# Patient Record
Sex: Male | Born: 1965 | Race: White | Hispanic: No | Smoking: Current every day smoker
Health system: Southern US, Community
[De-identification: ages and names within clinical notes are randomized; demographics above are authoritative.]

## PROBLEM LIST (undated history)

## (undated) DIAGNOSIS — I2510 Atherosclerotic heart disease of native coronary artery without angina pectoris: Secondary | ICD-10-CM

## (undated) DIAGNOSIS — I10 Essential (primary) hypertension: Secondary | ICD-10-CM

## (undated) DIAGNOSIS — H3411 Central retinal artery occlusion, right eye: Secondary | ICD-10-CM

## (undated) HISTORY — PX: OTHER SURGICAL HISTORY: SHX169

## (undated) HISTORY — PX: CARDIAC CATHETERIZATION: SHX172

## (undated) HISTORY — DX: Essential (primary) hypertension: I10

## (undated) HISTORY — PX: CORONARY ANGIOPLASTY: SHX604

## (undated) HISTORY — DX: Central retinal artery occlusion, right eye: H34.11

---

## 1999-06-22 ENCOUNTER — Emergency Department (HOSPITAL_COMMUNITY): Admission: EM | Admit: 1999-06-22 | Discharge: 1999-06-22 | Payer: Self-pay

## 1999-06-23 ENCOUNTER — Encounter: Payer: Self-pay | Admitting: Emergency Medicine

## 2012-10-11 ENCOUNTER — Emergency Department (HOSPITAL_COMMUNITY): Payer: 59

## 2012-10-11 ENCOUNTER — Inpatient Hospital Stay (HOSPITAL_COMMUNITY)
Admission: EM | Admit: 2012-10-11 | Discharge: 2012-10-12 | DRG: 247 | Disposition: A | Discharge status: Home / Self Care | Payer: 59 | Attending: Cardiology | Admitting: Cardiology

## 2012-10-11 ENCOUNTER — Encounter (HOSPITAL_COMMUNITY)
Admission: EM | Disposition: A | Discharge status: Home / Self Care | Payer: Self-pay | Source: Home / Self Care | Attending: Cardiology

## 2012-10-11 DIAGNOSIS — F17200 Nicotine dependence, unspecified, uncomplicated: Secondary | ICD-10-CM | POA: Diagnosis present

## 2012-10-11 DIAGNOSIS — I2510 Atherosclerotic heart disease of native coronary artery without angina pectoris: Principal | ICD-10-CM | POA: Diagnosis present

## 2012-10-11 DIAGNOSIS — E739 Lactose intolerance, unspecified: Secondary | ICD-10-CM | POA: Diagnosis present

## 2012-10-11 DIAGNOSIS — I200 Unstable angina: Secondary | ICD-10-CM | POA: Diagnosis present

## 2012-10-11 DIAGNOSIS — I10 Essential (primary) hypertension: Secondary | ICD-10-CM | POA: Diagnosis present

## 2012-10-11 DIAGNOSIS — E7800 Pure hypercholesterolemia, unspecified: Secondary | ICD-10-CM | POA: Diagnosis present

## 2012-10-11 DIAGNOSIS — Z8249 Family history of ischemic heart disease and other diseases of the circulatory system: Secondary | ICD-10-CM

## 2012-10-11 DIAGNOSIS — I249 Acute ischemic heart disease, unspecified: Secondary | ICD-10-CM

## 2012-10-11 HISTORY — PX: LEFT HEART CATHETERIZATION WITH CORONARY ANGIOGRAM: SHX5451

## 2012-10-11 LAB — MAGNESIUM: Magnesium: 2 mg/dL (ref 1.5–2.5)

## 2012-10-11 LAB — BASIC METABOLIC PANEL
CO2: 29 mEq/L (ref 19–32)
Calcium: 9.7 mg/dL (ref 8.4–10.5)
Creatinine, Ser: 0.8 mg/dL (ref 0.50–1.35)
Glucose, Bld: 112 mg/dL — ABNORMAL HIGH (ref 70–99)

## 2012-10-11 LAB — CBC
Hemoglobin: 14.8 g/dL (ref 13.0–17.0)
MCH: 31.5 pg (ref 26.0–34.0)
MCV: 91.3 fL (ref 78.0–100.0)
RBC: 4.7 MIL/uL (ref 4.22–5.81)

## 2012-10-11 LAB — PROTIME-INR: Prothrombin Time: 15.9 seconds — ABNORMAL HIGH (ref 11.6–15.2)

## 2012-10-11 SURGERY — LEFT HEART CATHETERIZATION WITH CORONARY ANGIOGRAM
Anesthesia: LOCAL

## 2012-10-11 MED ORDER — FENTANYL CITRATE 0.05 MG/ML IJ SOLN
INTRAMUSCULAR | Status: AC
  Filled 2012-10-11: qty 2

## 2012-10-11 MED ORDER — BIVALIRUDIN 250 MG IV SOLR
INTRAVENOUS | Status: AC
  Filled 2012-10-11: qty 250

## 2012-10-11 MED ORDER — EPTIFIBATIDE 75 MG/100ML IV SOLN
2.0000 ug/kg/min | INTRAVENOUS | Status: AC
  Administered 2012-10-11: 2 ug/kg/min via INTRAVENOUS
  Filled 2012-10-11: qty 100

## 2012-10-11 MED ORDER — SODIUM CHLORIDE 0.9 % IJ SOLN: 3.0000 mL | INTRAMUSCULAR | Status: DC | PRN

## 2012-10-11 MED ORDER — ASPIRIN EC 325 MG PO TBEC
325.0000 mg | DELAYED_RELEASE_TABLET | Freq: Once | ORAL | Status: AC
  Administered 2012-10-11: 325 mg via ORAL
  Filled 2012-10-11: qty 1

## 2012-10-11 MED ORDER — NITROGLYCERIN 0.2 MG/ML ON CALL CATH LAB
INTRAVENOUS | Status: AC
  Filled 2012-10-11: qty 1

## 2012-10-11 MED ORDER — SODIUM CHLORIDE 0.9 % IV SOLN
INTRAVENOUS | Status: AC
  Administered 2012-10-11: 17:00:00 via INTRAVENOUS

## 2012-10-11 MED ORDER — METOPROLOL TARTRATE 25 MG PO TABS
25.0000 mg | ORAL_TABLET | Freq: Two times a day (BID) | ORAL | Status: DC
  Administered 2012-10-11 – 2012-10-12 (×2): 25 mg via ORAL
  Filled 2012-10-11 (×2): qty 1

## 2012-10-11 MED ORDER — ATORVASTATIN CALCIUM 80 MG PO TABS
80.0000 mg | ORAL_TABLET | Freq: Every day | ORAL | Status: DC
  Administered 2012-10-11: 80 mg via ORAL
  Filled 2012-10-11 (×2): qty 1

## 2012-10-11 MED ORDER — ONDANSETRON HCL 4 MG/2ML IJ SOLN: 4.0000 mg | Freq: Four times a day (QID) | INTRAMUSCULAR | Status: DC | PRN

## 2012-10-11 MED ORDER — NITROGLYCERIN 0.4 MG SL SUBL: 0.4000 mg | SUBLINGUAL_TABLET | SUBLINGUAL | Status: DC | PRN

## 2012-10-11 MED ORDER — ASPIRIN 81 MG PO CHEW
81.0000 mg | CHEWABLE_TABLET | Freq: Every day | ORAL | Status: DC
  Filled 2012-10-11: qty 1

## 2012-10-11 MED ORDER — SODIUM CHLORIDE 0.9 % IV SOLN: INTRAVENOUS | Status: DC

## 2012-10-11 MED ORDER — ASPIRIN 300 MG RE SUPP
300.0000 mg | RECTAL | Status: DC
Start: 2012-10-11 — End: 2012-10-11

## 2012-10-11 MED ORDER — SODIUM CHLORIDE 0.9 % IV SOLN
0.2500 mg/kg/h | INTRAVENOUS | Status: AC
  Filled 2012-10-11: qty 250

## 2012-10-11 MED ORDER — SODIUM CHLORIDE 0.9 % IV SOLN: 250.0000 mL | INTRAVENOUS | Status: DC | PRN

## 2012-10-11 MED ORDER — ASPIRIN 81 MG PO CHEW: 324.0000 mg | CHEWABLE_TABLET | ORAL | Status: AC

## 2012-10-11 MED ORDER — MORPHINE SULFATE 2 MG/ML IJ SOLN: 2.0000 mg | INTRAMUSCULAR | Status: DC | PRN

## 2012-10-11 MED ORDER — LIDOCAINE HCL (PF) 1 % IJ SOLN
INTRAMUSCULAR | Status: AC
  Filled 2012-10-11: qty 30

## 2012-10-11 MED ORDER — MIDAZOLAM HCL 2 MG/2ML IJ SOLN
INTRAMUSCULAR | Status: AC
  Filled 2012-10-11: qty 2

## 2012-10-11 MED ORDER — ACETAMINOPHEN 325 MG PO TABS: 650.0000 mg | ORAL_TABLET | ORAL | Status: DC | PRN

## 2012-10-11 MED ORDER — HEPARIN SODIUM (PORCINE) 5000 UNIT/ML IJ SOLN
4000.0000 [IU] | Freq: Once | INTRAMUSCULAR | Status: AC
  Administered 2012-10-11: 4000 [IU] via INTRAVENOUS
  Filled 2012-10-11: qty 1

## 2012-10-11 MED ORDER — HEPARIN BOLUS VIA INFUSION: 4000.0000 [IU] | Freq: Once | INTRAVENOUS | Status: DC

## 2012-10-11 MED ORDER — ASPIRIN 81 MG PO CHEW: 324.0000 mg | CHEWABLE_TABLET | ORAL | Status: DC

## 2012-10-11 MED ORDER — DIAZEPAM 5 MG PO TABS: 5.0000 mg | ORAL_TABLET | ORAL | Status: AC

## 2012-10-11 MED ORDER — TICAGRELOR 90 MG PO TABS
ORAL_TABLET | ORAL | Status: AC
  Filled 2012-10-11: qty 2

## 2012-10-11 MED ORDER — SODIUM CHLORIDE 0.9 % IJ SOLN: 3.0000 mL | Freq: Two times a day (BID) | INTRAMUSCULAR | Status: DC

## 2012-10-11 MED ORDER — MORPHINE SULFATE 2 MG/ML IJ SOLN
INTRAMUSCULAR | Status: AC
  Filled 2012-10-11: qty 1

## 2012-10-11 MED ORDER — TICAGRELOR 90 MG PO TABS
180.0000 mg | ORAL_TABLET | Freq: Once | ORAL | Status: DC
  Filled 2012-10-11: qty 2

## 2012-10-11 MED ORDER — TICAGRELOR 90 MG PO TABS
90.0000 mg | ORAL_TABLET | Freq: Two times a day (BID) | ORAL | Status: DC
  Administered 2012-10-11 – 2012-10-12 (×2): 90 mg via ORAL
  Filled 2012-10-11 (×3): qty 1

## 2012-10-11 MED ORDER — NITROGLYCERIN IN D5W 200-5 MCG/ML-% IV SOLN
5.0000 ug/min | INTRAVENOUS | Status: DC
  Administered 2012-10-11: 5 ug/min via INTRAVENOUS

## 2012-10-11 MED ORDER — ASPIRIN EC 81 MG PO TBEC
81.0000 mg | DELAYED_RELEASE_TABLET | Freq: Every day | ORAL | Status: DC
  Administered 2012-10-12: 11:00:00 81 mg via ORAL
  Filled 2012-10-11: qty 1

## 2012-10-11 MED ORDER — EPTIFIBATIDE 75 MG/100ML IV SOLN
2.0000 ug/kg/min | INTRAVENOUS | Status: DC
  Filled 2012-10-11: qty 100

## 2012-10-11 NOTE — Progress Notes (Signed)
ANTICOAGULATION CONSULT NOTE - Follow Up Consult  Pharmacy Consult for Integrilin x 12 hours post-PCI Indication: post-PCI anti-platelet therapy  Allergies  Allergen Reactions  . Codeine Nausea And Vomiting    Patient Measurements: Height: 5\' 9"  (175.3 cm) Weight: 160 lb (72.576 kg) IBW/kg (Calculated) : 70.7  Integrilin Dosing Weight: 72.6 kg  Vital Signs: Temp: 97.7 F (36.5 C) (11/05 1647) Temp src: Oral (11/05 1647) BP: 130/71 mmHg (11/05 1647) Pulse Rate: 70  (11/05 1647)  Labs:  Basename 10/11/12 1135  HGB 14.8  HCT 42.9  PLT 245  APTT --  LABPROT --  INR --  HEPARINUNFRC --  CREATININE 0.80  CKTOTAL --  CKMB --  TROPONINI --    Estimated Creatinine Clearance: 112.9 ml/min (by C-G formula based on Cr of 0.8).  Assessment:   Angiomax and Integrilin begun in Cath Lab ~3pm. Now S/p PTCA/stent.   Angiomax bag almost empty;  Integrilin to continue for 12 hours.  RN reports no bleeding from groin site. Sheath still in.  Goal of Therapy:  Appropriate Integrilin dose for renal function; Platelet count > 150K Monitor platelets by anticoagulation protocol: Yes   Plan:    Continue Integrilin at 2 mcg/kg/min.   CBC ~ 8 hours after Integrilin began = 11pm.   Integrilin to stop ~3:10am.  Dennie Fetters, RPh Pager: [phone] 10/11/2012,5:15 PM

## 2012-10-11 NOTE — ED Notes (Signed)
Patient stated having chest pain with shortness of breath, diaphoresis, nausea and radiating pain to his left arm and leg.

## 2012-10-11 NOTE — H&P (Signed)
Blake Hughes is an 46 y.o. male.   Chief Complaint: Recurrent chest HPI: Patient is 46 year old male with no significant past medical history except for tobacco abuse and positive family history of coronary artery disease he came to the ER complaining of recurrent retrosternal and left-sided chest pain described as pressure tightness radiating to left arm off and on since Friday today while at work climbing the stairs the left severe chest pain grade 9/10 radiating to left arm associated with diaphoresis and shortness of breath. EKG done in the ER showed normal sinus rhythm with a minor T wave inversion in lead 1 and q. and T wave inversion in lead aVL and the minor T wave changes in septal leads. And was noted to have minimally elevated troponin I. Patient denies any palpitation lightheadedness or syncope denies pain the orthopnea leg swelling  No past medical history on file.  No past surgical history on file.  No family history on file. Social History:  does not have a smoking history on file. He does not have any smokeless tobacco history on file. His alcohol and drug histories not on file.  Allergies:  Allergies  Allergen Reactions  . Codeine Nausea And Vomiting     (Not in a hospital admission)  Results for orders placed during the hospital encounter of 10/11/12 (from the past 48 hour(s))  CBC     Status: Normal   Collection Time   10/11/12 11:35 AM      Component Value Range Comment   WBC 6.9  4.0 - 10.5 K/uL    RBC 4.70  4.22 - 5.81 MIL/uL    Hemoglobin 14.8  13.0 - 17.0 g/dL    HCT 16.1  09.6 - 04.5 %    MCV 91.3  78.0 - 100.0 fL    MCH 31.5  26.0 - 34.0 pg    MCHC 34.5  30.0 - 36.0 g/dL    RDW 40.9  81.1 - 91.4 %    Platelets 245  150 - 400 K/uL   BASIC METABOLIC PANEL     Status: Abnormal   Collection Time   10/11/12 11:35 AM      Component Value Range Comment   Sodium 134 (*) 135 - 145 mEq/L    Potassium 4.7  3.5 - 5.1 mEq/L    Chloride 98  96 - 112 mEq/L    CO2  29  19 - 32 mEq/L    Glucose, Bld 112 (*) 70 - 99 mg/dL    BUN 11  6 - 23 mg/dL    Creatinine, Ser 7.82  0.50 - 1.35 mg/dL    Calcium 9.7  8.4 - 95.6 mg/dL    GFR calc non Af Amer >90  >90 mL/min    GFR calc Af Amer >90  >90 mL/min   POCT I-STAT TROPONIN I     Status: Abnormal   Collection Time   10/11/12 12:22 PM      Component Value Range Comment   Troponin i, poc 0.14 (*) 0.00 - 0.08 ng/mL    Comment NOTIFIED PHYSICIAN      Comment 3             Dg Chest 2 View  10/11/2012  *RADIOLOGY REPORT*  Clinical Data: Chest pain  CHEST - 2 VIEW  Comparison: None.  Findings: The heart and pulmonary vascularity are within normal limits.  The lungs are clear bilaterally.  Bilateral nipple shadows are noted.  No bony abnormality is seen.  IMPRESSION:  No acute abnormality noted.   Original Report Authenticated By: Alcide Clever, M.D.     Review of Systems  Constitutional: Negative for fever and chills.  Eyes: Negative for blurred vision and double vision.  Respiratory: Negative for cough, hemoptysis, sputum production and shortness of breath.   Cardiovascular: Positive for chest pain. Negative for palpitations, orthopnea and claudication.  Gastrointestinal: Negative for heartburn, nausea, vomiting and abdominal pain.  Neurological: Negative for dizziness and headaches.    Blood pressure 145/87, pulse 69, temperature 97.1 F (36.2 C), temperature source Oral, resp. rate 16, SpO2 100.00%. Physical Exam  Constitutional: He is oriented to person, place, and time. He appears well-developed and well-nourished.  HENT:  Head: Normocephalic and atraumatic.  Eyes: Conjunctivae normal and EOM are normal. Pupils are equal, round, and reactive to light. Left eye exhibits no discharge. No scleral icterus.  Neck: Normal range of motion. Neck supple. No JVD present. No tracheal deviation present. No thyromegaly present.  Cardiovascular: Normal rate and regular rhythm.        Soft systolic murmur and S4 gallop  noted  Respiratory: Effort normal and breath sounds normal. No respiratory distress. He has no wheezes. He has no rales.  GI: Soft. Bowel sounds are normal. He exhibits no distension. There is no tenderness. There is no rebound.  Musculoskeletal: He exhibits no edema and no tenderness.  Neurological: He is alert and oriented to person, place, and time.     Assessment/Plan Acute coronary syndrome New-onset hypertension Tobacco abuse Positive family history of coronary artery disease Plan Discussed with patient at length regarding left cath possible PTCA stenting its risk and benefits i.e. death MI stroke need for emergency CABG local last complications risk of restenosis etc. and consents for PCI  [facility] 10/11/2012, 2:06 PM

## 2012-10-11 NOTE — ED Provider Notes (Signed)
History     CSN: [id number]  Arrival date & time 10/11/12  1106   First MD Initiated Contact with Patient 10/11/12 1244      Chief Complaint  Patient presents with  . Chest Pain  . Shortness of Breath    (Consider location/radiation/quality/duration/timing/severity/associated sxs/prior treatment) HPI Comments: The patient presents with chest pain. He has no history of this before but he has had intermittent episodes since 4 days ago. He has had a total of 3 episodes and although been associated with exertion. This morning he had his worst episode while going upstairs, and was associated with shortness of breath, diaphoresis, radiation down his arm. He also felt lightheaded like he was going to pass out. Denies any medical problems, does not take medications. He has not seen a doctor in about 20 years.  Patient is a 46 y.o. male presenting with chest pain and shortness of breath. The history is provided by the patient. No language interpreter was used.  Chest Pain The chest pain began 3 - 5 hours ago. Duration of episode(s) is 3 hours. Chest pain occurs constantly. The chest pain is improving. The pain is associated with exertion. At its most intense, the pain is at 10/10. The pain is currently at 4/10. The severity of the pain is moderate. The quality of the pain is described as sharp, pressure-like and tightness. The pain radiates to the left arm. Chest pain is worsened by exertion. Primary symptoms include syncope, shortness of breath and nausea. Pertinent negatives for primary symptoms include no fever, no fatigue, no cough, no wheezing, no abdominal pain, no vomiting, no dizziness and no altered mental status.  Before the onset of the syncopal episode there was nausea. There was no dizziness. The syncopal episode occurred with shortness of breath.  Associated symptoms include diaphoresis.  Pertinent negatives for associated symptoms include no claudication, no lower extremity edema and no  near-syncope. He tried aspirin for the symptoms. Risk factors include male gender and smoking/tobacco exposure.  Pertinent negatives for past medical history include no CAD, no cancer, no diabetes, no hyperlipidemia, no hypertension and no MI.  His family medical history is significant for early MI in family (mother at 42).  Procedure history is negative for cardiac catheterization.    Shortness of Breath  Associated symptoms include chest pain and shortness of breath. Pertinent negatives include no fever, no sore throat, no cough and no wheezing.    No past medical history on file.  No past surgical history on file.  No family history on file.  History  Substance Use Topics  . Smoking status: Not on file  . Smokeless tobacco: Not on file  . Alcohol Use: Not on file      Review of Systems  Constitutional: Positive for diaphoresis. Negative for fever, activity change, appetite change and fatigue.  HENT: Negative for sore throat and neck pain.   Eyes: Negative for discharge and visual disturbance.  Respiratory: Positive for shortness of breath. Negative for cough, choking and wheezing.   Cardiovascular: Positive for chest pain and syncope. Negative for claudication, leg swelling and near-syncope.  Gastrointestinal: Positive for nausea. Negative for vomiting, abdominal pain, diarrhea and constipation.  Genitourinary: Negative for dysuria and difficulty urinating.  Musculoskeletal: Negative for back pain and arthralgias.  Skin: Negative for color change and pallor.  Neurological: Negative for dizziness, speech difficulty and light-headedness.  Psychiatric/Behavioral: Negative for behavioral problems, agitation and altered mental status.  All other systems reviewed and are negative.  Allergies  Codeine  Home Medications   Current Outpatient Rx  Name  Route  Sig  Dispense  Refill  . ASPIRIN 325 MG PO TABS   Oral   Take 325 mg by mouth daily as needed. For pain             BP 173/103  Pulse 84  Temp 97.1 F (36.2 C) (Oral)  Resp 20  Physical Exam  Constitutional: He appears well-developed. No distress.  HENT:  Head: Normocephalic and atraumatic.  Mouth/Throat: No oropharyngeal exudate.  Eyes: EOM are normal. Pupils are equal, round, and reactive to light. Right eye exhibits no discharge. Left eye exhibits no discharge.  Neck: Normal range of motion. Neck supple. No JVD present.  Cardiovascular: Normal rate, regular rhythm and normal heart sounds.   Pulmonary/Chest: Effort normal and breath sounds normal. No stridor. No respiratory distress. He has no wheezes. He has no rales. He exhibits tenderness (mild left upper lateral).  Abdominal: Soft. Bowel sounds are normal. There is no tenderness. There is no guarding.  Genitourinary: Penis normal.  Musculoskeletal: Normal range of motion. He exhibits no edema and no tenderness.  Neurological: He is alert. No cranial nerve deficit. He exhibits normal muscle tone.  Skin: Skin is warm and dry. He is not diaphoretic. No erythema. No pallor.  Psychiatric: He has a normal mood and affect. His behavior is normal. Judgment and thought content normal.       anxious    ED Course  Procedures (including critical care time)  Labs Reviewed  BASIC METABOLIC PANEL - Abnormal; Notable for the following:    Sodium 134 (*)     Glucose, Bld 112 (*)     All other components within normal limits  POCT I-STAT TROPONIN I - Abnormal; Notable for the following:    Troponin i, poc 0.14 (*)     All other components within normal limits  CBC  TROPONIN I  TROPONIN I  TROPONIN I  PROTIME-INR  APTT  CBC WITH DIFFERENTIAL  TSH  COMPREHENSIVE METABOLIC PANEL  MAGNESIUM   Dg Chest 2 View  10/11/2012  *RADIOLOGY REPORT*  Clinical Data: Chest pain  CHEST - 2 VIEW  Comparison: None.  Findings: The heart and pulmonary vascularity are within normal limits.  The lungs are clear bilaterally.  Bilateral nipple shadows are noted.   No bony abnormality is seen.  IMPRESSION: No acute abnormality noted.   Original Report Authenticated By: Alcide Clever, M.D.      Dx: Chest pain NSTEMI    MDM  12:56 PM patient assessed by me. He has symptoms concerning for acute coronary syndrome and has a mild elevation in his troponin. He is not having an ST elevation MI, although he does have some peak T waves in his inferior leads and a biphasic T wave in V2. Will consult cardiology, will give aspirin, nitroglycerin for chest pain. Hypertensive but otherwise stable. Doubt dissection, PE, pneumonia, pneumothorax.  1:05 PM cardiology to see pt, they recommend starting heparin and ticagrelor.  Admitted to cardiology in stable condition    Warrick Parisian, MD 10/11/12 1538

## 2012-10-11 NOTE — CV Procedure (Signed)
Left cath/PTCA stenting report dictated on 10/11/2012 dictation number is [id number]

## 2012-10-11 NOTE — Progress Notes (Signed)
Site area: right groin  Site Prior to Removal:  Level 0  Pressure Applied For 30 MINUTES    Minutes Beginning at 30  Manual:   yes  Patient Status During Pull:  stable  Post Pull Groin Site:  Level 1  Post Pull Instructions Given:  yes  Post Pull Pulses Present:  yes  Dressing Applied:  yes   Comments:   Extra 10 min pressure due to hematoma which reduced in size

## 2012-10-12 ENCOUNTER — Encounter (HOSPITAL_COMMUNITY): Payer: Self-pay | Admitting: *Deleted

## 2012-10-12 LAB — CBC
HCT: 36.6 % — ABNORMAL LOW (ref 39.0–52.0)
Hemoglobin: 12.6 g/dL — ABNORMAL LOW (ref 13.0–17.0)
MCH: 31.3 pg (ref 26.0–34.0)
MCH: 31.7 pg (ref 26.0–34.0)
MCV: 92 fL (ref 78.0–100.0)
MCV: 92.2 fL (ref 78.0–100.0)
Platelets: 185 10*3/uL (ref 150–400)
RBC: 3.97 MIL/uL — ABNORMAL LOW (ref 4.22–5.81)
RBC: 4.02 MIL/uL — ABNORMAL LOW (ref 4.22–5.81)
WBC: 7 10*3/uL (ref 4.0–10.5)

## 2012-10-12 LAB — LIPID PANEL
HDL: 41 mg/dL (ref >39)
LDL Cholesterol: 98 mg/dL (ref 0–99)
VLDL: 56 mg/dL — ABNORMAL HIGH (ref 0–40)

## 2012-10-12 LAB — BASIC METABOLIC PANEL
CO2: 24 mEq/L (ref 19–32)
Calcium: 8.7 mg/dL (ref 8.4–10.5)
Chloride: 101 mEq/L (ref 96–112)
Creatinine, Ser: 0.91 mg/dL (ref 0.50–1.35)
Glucose, Bld: 118 mg/dL — ABNORMAL HIGH (ref 70–99)
Sodium: 133 mEq/L — ABNORMAL LOW (ref 135–145)

## 2012-10-12 LAB — TSH: TSH: 2.237 u[IU]/mL (ref 0.350–4.500)

## 2012-10-12 LAB — POCT ACTIVATED CLOTTING TIME: Activated Clotting Time: 124 seconds

## 2012-10-12 MED ORDER — TICAGRELOR 90 MG PO TABS: 90.0000 mg | ORAL_TABLET | Freq: Two times a day (BID) | ORAL | Status: DC

## 2012-10-12 MED ORDER — ATORVASTATIN CALCIUM 80 MG PO TABS: 80.0000 mg | ORAL_TABLET | Freq: Every day | ORAL | Status: DC

## 2012-10-12 MED ORDER — TICAGRELOR 90 MG PO TABS: 180.0000 mg | ORAL_TABLET | Freq: Once | ORAL | Status: DC

## 2012-10-12 MED ORDER — METOPROLOL TARTRATE 25 MG PO TABS: 25.0000 mg | ORAL_TABLET | Freq: Two times a day (BID) | ORAL | Status: DC

## 2012-10-12 MED ORDER — ASPIRIN 81 MG PO TBEC: 81.0000 mg | DELAYED_RELEASE_TABLET | Freq: Every day | ORAL | Status: DC

## 2012-10-12 MED ORDER — NITROGLYCERIN 0.4 MG SL SUBL: 0.4000 mg | SUBLINGUAL_TABLET | SUBLINGUAL | Status: DC | PRN

## 2012-10-12 MED FILL — Dextrose Inj 5%: INTRAVENOUS | Qty: 50 | Status: AC

## 2012-10-12 NOTE — Progress Notes (Signed)
CARDIAC REHAB PHASE I   PRE:  Rate/Rhythm: 87SR  BP:  Supine:   Sitting: 125/86  Standing:    SaO2:   MODE:  Ambulation: 700 ft   POST:  Rate/Rhythem: 84SR  BP:  Supine:   Sitting: 149/69  Standing:    SaO2:  0850-1000 Pt walked 700 ft with steady gait. Denied CP. Tolerated well. Education completed. Did not refer to CRP2 due to pt travels out of town for work. Discussed smoking cessation. Pt states he will not be able to quit cold [place]. Gave handouts and discussed 1800QUITNOW. Gave fake cigarette. Pt very motivated to make risk factor changes. Discussed healthy diet choices.  Duanne Limerick

## 2012-10-12 NOTE — Cardiovascular Report (Signed)
NAME:  ARNET, HOFFERBER NO.:  [id number]  MEDICAL RECORD NO.:  [id number]  LOCATION:  6529                         FACILITY:  [facility]  PHYSICIAN:  Eduardo Osier. Sharyn Lull, M.D. DATE OF BIRTH:  [birth year]  DATE OF PROCEDURE:  10/11/2012 DATE OF DISCHARGE:                           CARDIAC CATHETERIZATION   PROCEDURE: 1. Left cardiac catheterization with selective left and right coronary     angiography, left ventriculography via right groin using Judkins     technique. 2. Successful percutaneous transluminal coronary angioplasty to     proximal left anterior descending using 2.5 x 12 mm long Trek     balloon. 3. Successful deployment of 2.75 x 18 mm long Xience Xpedition drug-     eluting stent in proximal left anterior descending. 4. Successful postdilatation of this stent using 2.75 x 15-mm long [place]     Trek balloon.  INDICATION FOR THE PROCEDURE:  Mr. Blake Hughes is a 46 year old male with no significant past medical history except for tobacco abuse and positive family history of coronary artery disease who came to the ER complaining of recurrent retrosternal and left-sided chest pain, described as pressure, tightness, radiating to left arm off and on since Friday.  Today while at work, climbing stairs he developed severe left- sided chest pain grade 9/10 radiating to the left arm associated with diaphoresis and shortness of breath.  EKG done in the ER showed normal sinus rhythm with minor T-wave changes in lead 1 and Q-waves and T-wave inversion in lead aVL and minor ST-T wave changes in the septal leads and was noted to have minimally elevated troponin-I.  Due to typical anginal chest pain, elevated cardiac enzymes, and risk factors, discussed with the patient regarding left cath, possible PTCA, stenting, its risks and benefits, i.e., death, MI, stroke, need for emergency CABG, local vascular complications, etc., and consents for PCI.  PROCEDURE:  After obtaining the  informed consent, the patient was brought to the cath lab and was placed on fluoroscopy table.  Right groin was prepped and draped in usual fashion.  Xylocaine 1% was used for local anesthesia in the right groin.  With the help of thin-walled needle, 6-French arterial sheath was placed.  The sheath was aspirated and flushed.  Next, 6-French left Judkins catheter was advanced over the wire under fluoroscopic guidance up to the ascending aorta.  Wire was pulled out.  The catheter was aspirated and connected to the Manifold. Catheter was further advanced and engaged into left coronary ostium. Multiple views of the left system were taken.  Next, the catheter was disengaged and was pulled out over the wire and was replaced with 6- [place] right Judkins catheter, which was advanced over the wire under fluoroscopic guidance up to the ascending aorta.  Wire was pulled out. The catheter was aspirated and connected to the Manifold.  Catheter was further advanced and engaged into right coronary ostium.  Multiple views of the right system were taken.  Next, catheter was disengaged and was pulled out over the wire and was replaced with 6-French pigtail catheter, which was advanced over the wire under fluoroscopic guidance up to the ascending aorta.  Catheter was further advanced across the aortic valve  into the LV.  LV pressures were recorded.  Next, LV graphy was done in 30-degree RAO position.  Post angiographic pressures were recorded from LV and then pullback pressures were recorded from the aorta.  There was no significant gradient across the aortic valve. Next, pigtail catheter was pulled out over the wire.  Sheaths were aspirated and flushed.  FINDINGS:  LV showed good LV systolic function.  LVH, EF of 55% to 60%. Left main was patent.  LAD has 75% to 80% proximal stenosis.  Septal 1 is large which has 70% to 80% proximal stenosis.  Diagonal 1 and 2 were very small.  Diagonal 3 was moderate  size, which was patent.  LAD also has 40% to 50% mid focal stenosis and 60% to 70% apical stenosis.  Left circumflex has 40% to 50% proximal stenosis and then it tapers down in AV groove after the OM 3.  OM 1 was small, which was patent.  OM 2 was very, very small.  OM 3 was moderate sized, which has 60% to 65% multiple sequential stenosis.  RCA has 30% to 40% sequential mid stenosis and 50% to 60% distal diffuse stenosis.  PDA is very small. PLV branch is also small, which has mild disease.  The patient's vessel appears small and appears to be diffusely diseased diabetic vessels.  INTERVENTIONAL PROCEDURE:  Successful PTCA to proximal LAD was done using 2.5 x 12 mm long Trek balloon for predilatation and then 2.75 x 18 mm long Xience Xpedition drug-eluting stent was deployed in proximal LAD at 11 atmospheric pressure.  The stent was post dilated using 2.75 x 15 mm long [place] Trek balloon going up to 18 atmospheric pressure.  Lesion dilated from 75% to 80% to 0% residual with excellent TIMI grade 3 distal flow without evidence of dissection or distal embolization.  The patient received weight-based Angiomax, Integrilin, and 180 mg on Brilinta prior to the procedure.  The patient tolerated the procedure well.  There were no complications.  The patient was transferred to recovery room in stable condition.     Eduardo Osier. Sharyn Lull, M.D.     MNH/MEDQ  D:  10/11/2012  T:  10/12/2012  Job:  [id number]

## 2012-10-13 NOTE — Discharge Summary (Signed)
NAME:  LADD, CEN NO.:  [id number]  MEDICAL RECORD NO.:  [id number]  LOCATION:  6529                         FACILITY:  [facility]  PHYSICIAN:  Eduardo Osier. Sharyn Lull, M.D. DATE OF BIRTH:  16-Sep-1966  DATE OF ADMISSION:  10/11/2012 DATE OF DISCHARGE:  10/12/2012                              DISCHARGE SUMMARY   ADMITTING DIAGNOSES: 1. Acute coronary syndrome. 2. New-onset hypertension. 3. Tobacco abuse. 4. Positive family history of coronary artery disease.  DISCHARGE DIAGNOSES: 1. Acute coronary syndrome, status post left cath/PTCA stenting to     proximal LAD. 2. Multivessel moderate coronary artery disease. 3. Hypertension. 4. Hypercholesteremia. 5. Tobacco abuse. 6. Positive family history of coronary artery disease. 7. Glucose intolerance.  DISCHARGE HOME MEDICATIONS: 1. Enteric-coated aspirin 81 mg 1 tablet daily. 2. Atorvastatin 80 mg 1 tablet daily. 3. Lopressor 25 mg 1 tablet twice daily. 4. Nitrostat 0.4 mg sublingual use as directed. 5. Brilinta 90 mg 1 tablet twice daily. 6. The patient has been advised to stop aspirin 325 mg tablets.  DIET:  Low salt, low cholesterol.  ACTIVITY:  Increase activity slowly as tolerated.  Post-PTCA stent instructions have been given.  The patient will be scheduled for phase 2 cardiac rehab as outpatient.  CONDITION AT DISCHARGE:  Stable.  BRIEF HISTORY AND HOSPITAL COURSE:  Mr. Dieujuste is a 46 year old male with no significant past medical history except for tobacco abuse and positive family history of coronary artery disease.  He came to the ER complaining of recurrent retrosternal and left-sided chest pain described as pressure, tightness, radiating to left arm, off and on since Friday.  Today, while at work, climbing the stairs, he developed severe left-sided chest pain, grade 9/10 radiating to the left arm associated with diaphoresis and shortness of breath.  EKG done in the ER showed normal sinus rhythm  with Q and T-wave inversion in lead aVL and minor T-wave changes in septal leads.  The patient was noted to have minimally elevated troponin I.  The patient denies any palpitation, lightheadedness, or syncope.  Denies PND, orthopnea, or leg swelling.  PAST MEDICAL HISTORY:  None.  PAST SURGICAL HISTORY:  None.  SOCIAL HISTORY:  He is single.  Smokes 1 pack per day for many years. No history of alcohol or drug abuse.  PHYSICAL EXAMINATION:  VITAL SIGNS:  His blood pressure was 145/87, pulse was 69.  He was afebrile. HEENT:  Conjunctiva was pink. NECK:  Supple.  No JVD.  No bruit. LUNGS:  Clear to auscultation without rhonchi or rales. CARDIOVASCULAR:  S1 and S2 was normal.  There was soft systolic murmur and S4 gallop. ABDOMEN:  Soft.  Bowel sounds were present.  Nontender. EXTREMITIES:  There is no clubbing, cyanosis, or edema.  LABORATORY DATA:  Sodium was 134, potassium 4.7, BUN 11, creatinine 0.80, glucose was 112.  Repeat fasting sugar this morning is 118.  His first set of troponin I was 0.14.  Repeat troponin I by lab was 0.32. Next set was 0.30, which were in normal range.  His cholesterol was 195, triglycerides weer 282, LDL was 98, HDL was 41.  Hemoglobin was 14.8, hematocrit 42.9, white count of 6.9.  Repeat hemoglobin this  morning is 12.6, hematocrit 37, white count of 7.0, which has been stable since last night.  His TSH was 2.33.  Chest x-ray showed no acute cardiopulmonary disease.  Repeat EKG this morning showed normal sinus rhythm with ST-T wave changes in anterolateral leads.  BRIEF HOSPITAL COURSE:  The patient was directly taken to the Cath Lab from the emergency room and underwent left cardiac cath with selective left and right coronary angiography and PTCA stenting to proximal LAD as per procedure report.  The patient tolerated procedure well.  The patient did not have any further episodes of chest pain during the hospital stay.  Phase 1 cardiac rehab was  called.  The patient has been ambulating in hallway without any problems.  His groin was stable with minimal evidence of hematoma and ecchymosis.  There was no evidence of bruit.  The patient will be discharged home on above medications and will be followed up in 1 week.  The patient has been discussed extensively regarding lifestyle modification and smoking cessation and diet which he agrees.  The patient will be followed up in my office in 1 week and will be scheduled for phase 2 cardiac rehab as outpatient.     Eduardo Osier. Sharyn Lull, M.D.     MNH/MEDQ  D:  10/12/2012  T:  10/13/2012  Job:  [id number]

## 2012-10-15 NOTE — ED Provider Notes (Signed)
I saw and evaluated the patient, reviewed the resident's note and I agree with the findings and plan.  Tobin Chad, MD 10/15/12 [phone]

## 2014-11-15 ENCOUNTER — Encounter (HOSPITAL_COMMUNITY): Payer: Self-pay | Admitting: Cardiology

## 2018-07-02 ENCOUNTER — Other Ambulatory Visit: Payer: Self-pay

## 2018-07-02 ENCOUNTER — Emergency Department (HOSPITAL_COMMUNITY): Payer: Self-pay

## 2018-07-02 ENCOUNTER — Encounter (HOSPITAL_COMMUNITY): Payer: Self-pay | Admitting: Emergency Medicine

## 2018-07-02 ENCOUNTER — Observation Stay (HOSPITAL_COMMUNITY)
Admission: EM | Admit: 2018-07-02 | Discharge: 2018-07-04 | Disposition: A | Discharge status: Home / Self Care | Payer: Self-pay | Attending: Internal Medicine | Admitting: Internal Medicine

## 2018-07-02 DIAGNOSIS — F17200 Nicotine dependence, unspecified, uncomplicated: Secondary | ICD-10-CM | POA: Insufficient documentation

## 2018-07-02 DIAGNOSIS — K852 Alcohol induced acute pancreatitis without necrosis or infection: Secondary | ICD-10-CM | POA: Diagnosis present

## 2018-07-02 DIAGNOSIS — R079 Chest pain, unspecified: Secondary | ICD-10-CM | POA: Diagnosis present

## 2018-07-02 DIAGNOSIS — F1010 Alcohol abuse, uncomplicated: Secondary | ICD-10-CM | POA: Insufficient documentation

## 2018-07-02 DIAGNOSIS — I209 Angina pectoris, unspecified: Secondary | ICD-10-CM | POA: Insufficient documentation

## 2018-07-02 DIAGNOSIS — I200 Unstable angina: Principal | ICD-10-CM | POA: Insufficient documentation

## 2018-07-02 DIAGNOSIS — Z79899 Other long term (current) drug therapy: Secondary | ICD-10-CM | POA: Insufficient documentation

## 2018-07-02 DIAGNOSIS — F17210 Nicotine dependence, cigarettes, uncomplicated: Secondary | ICD-10-CM | POA: Insufficient documentation

## 2018-07-02 DIAGNOSIS — Z720 Tobacco use: Secondary | ICD-10-CM | POA: Diagnosis present

## 2018-07-02 DIAGNOSIS — K859 Acute pancreatitis without necrosis or infection, unspecified: Secondary | ICD-10-CM | POA: Insufficient documentation

## 2018-07-02 HISTORY — DX: Atherosclerotic heart disease of native coronary artery without angina pectoris: I25.10

## 2018-07-02 LAB — I-STAT TROPONIN, ED: Troponin i, poc: 0 ng/mL (ref 0.00–0.08)

## 2018-07-02 NOTE — ED Triage Notes (Signed)
Pt has a hx of stent placement in the past. States he is having stabbing central chest pain tonight. Endorses ETOH tonight.  Hypertensive in triage. No n/v.

## 2018-07-03 ENCOUNTER — Observation Stay (HOSPITAL_COMMUNITY): Payer: Self-pay

## 2018-07-03 ENCOUNTER — Encounter (HOSPITAL_COMMUNITY): Payer: Self-pay | Admitting: Internal Medicine

## 2018-07-03 DIAGNOSIS — Z720 Tobacco use: Secondary | ICD-10-CM | POA: Diagnosis present

## 2018-07-03 DIAGNOSIS — R079 Chest pain, unspecified: Secondary | ICD-10-CM

## 2018-07-03 DIAGNOSIS — F1010 Alcohol abuse, uncomplicated: Secondary | ICD-10-CM | POA: Diagnosis present

## 2018-07-03 LAB — RAPID URINE DRUG SCREEN, HOSP PERFORMED
AMPHETAMINES: NOT DETECTED
BENZODIAZEPINES: NOT DETECTED
Barbiturates: NOT DETECTED
Cocaine: NOT DETECTED
OPIATES: NOT DETECTED
TETRAHYDROCANNABINOL: NOT DETECTED

## 2018-07-03 LAB — BASIC METABOLIC PANEL
ANION GAP: 13 (ref 5–15)
BUN: 8 mg/dL (ref 6–20)
CO2: 22 mmol/L (ref 22–32)
Calcium: 9 mg/dL (ref 8.9–10.3)
Chloride: 100 mmol/L (ref 98–111)
Creatinine, Ser: 0.83 mg/dL (ref 0.61–1.24)
Glucose, Bld: 107 mg/dL — ABNORMAL HIGH (ref 70–99)
Potassium: 4.1 mmol/L (ref 3.5–5.1)
Sodium: 135 mmol/L (ref 135–145)

## 2018-07-03 LAB — I-STAT TROPONIN, ED: TROPONIN I, POC: 0 ng/mL (ref 0.00–0.08)

## 2018-07-03 LAB — TROPONIN I
Troponin I: <0.03 ng/mL (ref <0.03)
Troponin I: <0.03 ng/mL (ref <0.03)
Troponin I: <0.03 ng/mL (ref <0.03)

## 2018-07-03 LAB — CBC
HCT: 43 % (ref 39.0–52.0)
HEMATOCRIT: 44.4 % (ref 39.0–52.0)
HEMOGLOBIN: 14.8 g/dL (ref 13.0–17.0)
Hemoglobin: 14.2 g/dL (ref 13.0–17.0)
MCH: 31.9 pg (ref 26.0–34.0)
MCH: 32.1 pg (ref 26.0–34.0)
MCHC: 33.3 g/dL (ref 30.0–36.0)
MCHC: 33 g/dL (ref 30.0–36.0)
MCV: 95.7 fL (ref 78.0–100.0)
MCV: 97.1 fL (ref 78.0–100.0)
PLATELETS: 215 10*3/uL (ref 150–400)
Platelets: 242 10*3/uL (ref 150–400)
RBC: 4.64 MIL/uL (ref 4.22–5.81)
RBC: 4.43 MIL/uL (ref 4.22–5.81)
RDW: 11.9 % (ref 11.5–15.5)
RDW: 12.1 % (ref 11.5–15.5)
WBC: 8.6 10*3/uL (ref 4.0–10.5)
WBC: 5 10*3/uL (ref 4.0–10.5)

## 2018-07-03 LAB — LIPASE, BLOOD: LIPASE: 98 U/L — AB (ref 11–51)

## 2018-07-03 LAB — HEPATIC FUNCTION PANEL
ALBUMIN: 4 g/dL (ref 3.5–5.0)
ALK PHOS: 61 U/L (ref 38–126)
ALT: 26 U/L (ref 0–44)
AST: 25 U/L (ref 15–41)
Bilirubin, Direct: <0.1 mg/dL (ref 0.0–0.2)
TOTAL PROTEIN: 6.9 g/dL (ref 6.5–8.1)
Total Bilirubin: 0.6 mg/dL (ref 0.3–1.2)

## 2018-07-03 LAB — HEPARIN LEVEL (UNFRACTIONATED): HEPARIN UNFRACTIONATED: 0.32 [IU]/mL (ref 0.30–0.70)

## 2018-07-03 LAB — CREATININE, SERUM
Creatinine, Ser: 0.82 mg/dL (ref 0.61–1.24)
GFR calc Af Amer: >60 mL/min (ref >60)
GFR calc non Af Amer: >60 mL/min (ref >60)

## 2018-07-03 LAB — MRSA PCR SCREENING: MRSA by PCR: NEGATIVE

## 2018-07-03 LAB — HIV ANTIBODY (ROUTINE TESTING W REFLEX): HIV Screen 4th Generation wRfx: NONREACTIVE

## 2018-07-03 LAB — D-DIMER, QUANTITATIVE (NOT AT ARMC): D DIMER QUANT: 0.59 ug{FEU}/mL — AB (ref 0.00–0.50)

## 2018-07-03 MED ORDER — HEPARIN (PORCINE) IN NACL 100-0.45 UNIT/ML-% IJ SOLN
1100.0000 [IU]/h | INTRAMUSCULAR | Status: DC
  Administered 2018-07-03: 900 [IU]/h via INTRAVENOUS
  Administered 2018-07-04: 1100 [IU]/h via INTRAVENOUS
  Filled 2018-07-03 (×2): qty 250

## 2018-07-03 MED ORDER — LORAZEPAM 2 MG/ML IJ SOLN: 1.0000 mg | Freq: Four times a day (QID) | INTRAMUSCULAR | Status: DC | PRN

## 2018-07-03 MED ORDER — THIAMINE HCL 100 MG/ML IJ SOLN
100.0000 mg | Freq: Every day | INTRAMUSCULAR | Status: DC
  Filled 2018-07-03: qty 2

## 2018-07-03 MED ORDER — LORAZEPAM 2 MG/ML IJ SOLN: 0.0000 mg | Freq: Two times a day (BID) | INTRAMUSCULAR | Status: DC

## 2018-07-03 MED ORDER — TECHNETIUM TC 99M TETROFOSMIN IV KIT
10.0000 | PACK | Freq: Once | INTRAVENOUS | Status: AC | PRN
  Administered 2018-07-03: 10 via INTRAVENOUS

## 2018-07-03 MED ORDER — ASPIRIN EC 325 MG PO TBEC
325.0000 mg | DELAYED_RELEASE_TABLET | Freq: Every day | ORAL | Status: DC
  Administered 2018-07-03 – 2018-07-04 (×2): 325 mg via ORAL
  Filled 2018-07-03 (×2): qty 1

## 2018-07-03 MED ORDER — NITROGLYCERIN 2 % TD OINT
1.0000 [in_us] | TOPICAL_OINTMENT | Freq: Four times a day (QID) | TRANSDERMAL | Status: DC
  Administered 2018-07-03 – 2018-07-04 (×3): 1 [in_us] via TOPICAL
  Filled 2018-07-03: qty 1
  Filled 2018-07-03: qty 30

## 2018-07-03 MED ORDER — CYCLOBENZAPRINE HCL 10 MG PO TABS
10.0000 mg | ORAL_TABLET | Freq: Once | ORAL | Status: AC
  Administered 2018-07-03: 10 mg via ORAL
  Filled 2018-07-03: qty 1

## 2018-07-03 MED ORDER — NITROGLYCERIN 0.4 MG SL SUBL: 0.4000 mg | SUBLINGUAL_TABLET | SUBLINGUAL | Status: DC | PRN

## 2018-07-03 MED ORDER — FOLIC ACID 1 MG PO TABS
1.0000 mg | ORAL_TABLET | Freq: Every day | ORAL | Status: DC
  Administered 2018-07-03 – 2018-07-04 (×2): 1 mg via ORAL
  Filled 2018-07-03 (×2): qty 1

## 2018-07-03 MED ORDER — LORAZEPAM 1 MG PO TABS: 1.0000 mg | ORAL_TABLET | Freq: Four times a day (QID) | ORAL | Status: DC | PRN

## 2018-07-03 MED ORDER — REGADENOSON 0.4 MG/5ML IV SOLN
0.4000 mg | Freq: Once | INTRAVENOUS | Status: AC
  Administered 2018-07-03: 0.4 mg via INTRAVENOUS

## 2018-07-03 MED ORDER — ONDANSETRON HCL 4 MG/2ML IJ SOLN: 4.0000 mg | Freq: Four times a day (QID) | INTRAMUSCULAR | Status: DC | PRN

## 2018-07-03 MED ORDER — REGADENOSON 0.4 MG/5ML IV SOLN
INTRAVENOUS | Status: AC
  Filled 2018-07-03: qty 5

## 2018-07-03 MED ORDER — LORAZEPAM 2 MG/ML IJ SOLN: 0.0000 mg | Freq: Four times a day (QID) | INTRAMUSCULAR | Status: DC

## 2018-07-03 MED ORDER — ASPIRIN 81 MG PO CHEW
324.0000 mg | CHEWABLE_TABLET | Freq: Once | ORAL | Status: AC
  Administered 2018-07-03: 324 mg via ORAL
  Filled 2018-07-03: qty 4

## 2018-07-03 MED ORDER — CHLORDIAZEPOXIDE HCL 5 MG PO CAPS
25.0000 mg | ORAL_CAPSULE | Freq: Four times a day (QID) | ORAL | Status: DC
  Administered 2018-07-03: 25 mg via ORAL
  Filled 2018-07-03 (×2): qty 5

## 2018-07-03 MED ORDER — VITAMIN B-1 100 MG PO TABS
100.0000 mg | ORAL_TABLET | Freq: Every day | ORAL | Status: DC
  Administered 2018-07-03 – 2018-07-04 (×2): 100 mg via ORAL
  Filled 2018-07-03 (×2): qty 1

## 2018-07-03 MED ORDER — ACETAMINOPHEN 325 MG PO TABS: 650.0000 mg | ORAL_TABLET | ORAL | Status: DC | PRN

## 2018-07-03 MED ORDER — ENOXAPARIN SODIUM 40 MG/0.4ML ~~LOC~~ SOLN
40.0000 mg | SUBCUTANEOUS | Status: DC
  Filled 2018-07-03: qty 0.4

## 2018-07-03 MED ORDER — HEPARIN BOLUS VIA INFUSION
4000.0000 [IU] | Freq: Once | INTRAVENOUS | Status: AC
  Administered 2018-07-03: 4000 [IU] via INTRAVENOUS
  Filled 2018-07-03: qty 4000

## 2018-07-03 MED ORDER — ADULT MULTIVITAMIN W/MINERALS CH
1.0000 | ORAL_TABLET | Freq: Every day | ORAL | Status: DC
  Administered 2018-07-03 – 2018-07-04 (×2): 1 via ORAL
  Filled 2018-07-03 (×2): qty 1

## 2018-07-03 MED ORDER — TECHNETIUM TC 99M TETROFOSMIN IV KIT
30.0000 | PACK | Freq: Once | INTRAVENOUS | Status: AC | PRN
  Administered 2018-07-03: 30 via INTRAVENOUS

## 2018-07-03 MED ORDER — MORPHINE SULFATE (PF) 4 MG/ML IV SOLN: 2.0000 mg | INTRAVENOUS | Status: DC | PRN

## 2018-07-03 MED ORDER — IOPAMIDOL (ISOVUE-370) INJECTION 76%
INTRAVENOUS | Status: AC
Start: 2018-07-03 — End: 2018-07-03
  Administered 2018-07-03: 100 mL
  Filled 2018-07-03: qty 100

## 2018-07-03 NOTE — Consult Note (Signed)
Referring Physician: Dr. Jadene Pierini. Blake Hughes  Blake Hughes is an 52 y.o. male.                       Chief Complaint: Chest pain   HPI: 52 year old male with PMH of CAD, LAD stent in 2013, COPD and tobacco use disorder, has recurrent chest pain lasting for 4-5 minutes and relieved by rest. Patient denies SL NTG use. Some of the chest pain is positional. He denies fever, cough, nausea and vomiting and abdominal pain.He has increased leg cramps for few days for which he is taking potassium pills.  Past Medical History:  Diagnosis Date  . Coronary artery disease       Past Surgical History:  Procedure Laterality Date  . CARDIAC CATHETERIZATION    . CORONARY ANGIOPLASTY    . LEFT HEART CATHETERIZATION WITH CORONARY ANGIOGRAM N/A 10/11/2012   Procedure: LEFT HEART CATHETERIZATION WITH CORONARY ANGIOGRAM;  Surgeon: Clent Demark, MD;  Location: [facility];  Service: Cardiovascular;  Laterality: N/A;  . right arm surgery      Family History  Problem Relation Age of Onset  . CAD Mother   . Diabetes Mellitus II Father   . Diabetes Mellitus II Sister    Social History:  reports that he has been smoking cigarettes.  He has a 30.00 pack-year smoking history. He has never used smokeless tobacco. He reports that he drinks alcohol. He reports that he does not use drugs.  Allergies:  Allergies  Allergen Reactions  . Codeine Nausea And Vomiting     (Not in a hospital admission)  Results for orders placed or performed during the hospital encounter of 07/02/18 (from the past 48 hour(s))  Basic metabolic panel     Status: Abnormal   Collection Time: 07/02/18 11:18 PM  Result Value Ref Range   Sodium 135 135 - 145 mmol/L   Potassium 4.1 3.5 - 5.1 mmol/L   Chloride 100 98 - 111 mmol/L   CO2 22 22 - 32 mmol/L   Glucose, Bld 107 (H) 70 - 99 mg/dL   BUN 8 6 - 20 mg/dL   Creatinine, Ser 0.83 0.61 - 1.24 mg/dL   Calcium 9.0 8.9 - 10.3 mg/dL   GFR calc non Af Amer >60 >60  mL/min   GFR calc Af Amer >60 >60 mL/min    Comment: (NOTE) The eGFR has been calculated using the CKD EPI equation. This calculation has not been validated in all clinical situations. eGFR's persistently <60 mL/min signify possible Chronic Kidney Disease.    Anion gap 13 5 - 15    Comment: Performed at [facility] [address] [place] [address]  CBC     Status: None   Collection Time: 07/02/18 11:18 PM  Result Value Ref Range   WBC 8.6 4.0 - 10.5 K/uL   RBC 4.64 4.22 - 5.81 MIL/uL   Hemoglobin 14.8 13.0 - 17.0 g/dL   HCT 44.4 39.0 - 52.0 %   MCV 95.7 78.0 - 100.0 fL   MCH 31.9 26.0 - 34.0 pg   MCHC 33.3 30.0 - 36.0 g/dL   RDW 11.9 11.5 - 15.5 %   Platelets 242 150 - 400 K/uL    Comment: Performed at [facility] [address] [place], [place] [address]  I-stat troponin, ED     Status: None   Collection Time: 07/02/18 11:35 PM  Result Value Ref Range  Troponin i, poc 0.00 0.00 - 0.08 ng/mL   Comment 3            Comment: Due to the release kinetics of cTnI, a negative result within the first hours of the onset of symptoms does not rule out myocardial infarction with certainty. If myocardial infarction is still suspected, repeat the test at appropriate intervals.   I-stat troponin, ED     Status: None   Collection Time: 07/03/18  4:25 AM  Result Value Ref Range   Troponin i, poc 0.00 0.00 - 0.08 ng/mL   Comment 3            Comment: Due to the release kinetics of cTnI, a negative result within the first hours of the onset of symptoms does not rule out myocardial infarction with certainty. If myocardial infarction is still suspected, repeat the test at appropriate intervals.   CBC     Status: None   Collection Time: 07/03/18  6:21 AM  Result Value Ref Range   WBC 5.0 4.0 - 10.5 K/uL   RBC 4.43 4.22 - 5.81 MIL/uL   Hemoglobin 14.2 13.0 - 17.0 g/dL   HCT 43.0 39.0 - 52.0 %   MCV 97.1 78.0 - 100.0 fL   MCH 32.1 26.0 - 34.0 pg   MCHC  33.0 30.0 - 36.0 g/dL   RDW 12.1 11.5 - 15.5 %   Platelets 215 150 - 400 K/uL    Comment: Performed at [facility] [address], [place], [place] [address]  Creatinine, serum     Status: None   Collection Time: 07/03/18  6:21 AM  Result Value Ref Range   Creatinine, Ser 0.82 0.61 - 1.24 mg/dL   GFR calc non Af Amer >60 >60 mL/min   GFR calc Af Amer >60 >60 mL/min    Comment: (NOTE) The eGFR has been calculated using the CKD EPI equation. This calculation has not been validated in all clinical situations. eGFR's persistently <60 mL/min signify possible Chronic Kidney Disease. Performed at [facility], [place] [address], [place], [place] [address]   Troponin I (q 6hr x 3)     Status: None   Collection Time: 07/03/18  6:21 AM  Result Value Ref Range   Troponin I <0.03 <0.03 ng/mL    Comment: Performed at [facility] [address], [place], [place] [address]  D-dimer, quantitative (not at [facility])     Status: Abnormal   Collection Time: 07/03/18  6:21 AM  Result Value Ref Range   D-Dimer, Quant 0.59 (H) 0.00 - 0.50 ug/mL-FEU    Comment: (NOTE) At the manufacturer cut-off of 0.50 ug/mL FEU, this assay has been documented to exclude PE with a sensitivity and negative predictive value of 97 to 99%.  At this time, this assay has not been approved by the FDA to exclude DVT/VTE. Results should be correlated with clinical presentation. Performed at [facility], [place] [address] [place], [place] [address]   Hepatic function panel     Status: None   Collection Time: 07/03/18  6:21 AM  Result Value Ref Range   Total Protein 6.9 6.5 - 8.1 g/dL   Albumin 4.0 3.5 - 5.0 g/dL   AST 25 15 - 41 U/L   ALT 26 0 - 44 U/L   Alkaline Phosphatase 61 38 - 126 U/L   Total Bilirubin 0.6 0.3 - 1.2 mg/dL   Bilirubin, Direct <0.1 0.0 - 0.2 mg/dL  Indirect Bilirubin NOT CALCULATED 0.3 - 0.9 mg/dL    Comment: Performed at [facility], [place] [address] [place], [place]  [address]  Lipase, blood     Status: Abnormal   Collection Time: 07/03/18  6:21 AM  Result Value Ref Range   Lipase 98 (H) 11 - 51 U/L    Comment: Performed at [facility], [place] [address], [place], [place] [address]   Dg Chest 2 View  Result Date: 07/03/2018 CLINICAL DATA:  Chest pain.  Central chest pain tonight. EXAM: CHEST - 2 VIEW COMPARISON:  Radiographs 11/10/2012 FINDINGS: Posterior costophrenic angles excluded from the field of view. Borderline hyperinflation. Normal heart size and mediastinal contours. No pulmonary edema, pneumothorax or focal airspace disease. No large pleural effusion. No acute osseous abnormalities are seen. IMPRESSION: Borderline hyperinflation suggesting asthma or COPD. No acute chest findings. Electronically Signed   By: Jeb Levering M.D.   On: 07/03/2018 00:07    Review Of Systems Constitutional: No fever, chills, weight loss or gain. Eyes: No vision change, wears glasses. No discharge or pain. Ears: No hearing loss, No tinnitus. Respiratory: No asthma, COPD, pneumonias. Positive shortness of breath. No hemoptysis. Cardiovascular: Positive chest pain, no palpitation, leg edema. Gastrointestinal: No nausea, vomiting, diarrhea, constipation. No GI bleed. No hepatitis. Genitourinary: No dysuria, hematuria, kidney stone. No incontinance. Neurological: No headache, stroke, seizures.  Psychiatry: No psych facility admission for anxiety, depression, suicide. No detox. Skin: No rash. Musculoskeletal: No joint pain, fibromyalgia. No neck pain, back pain. Lymphadenopathy: No lymphadenopathy. Hematology: No anemia or easy bruising.   Blood pressure 135/78, pulse 83, temperature (!) 97.5 F (36.4 C), temperature source Oral, resp. rate 14, height 5' 9"  (1.753 m), weight 72.6 kg (160 lb), SpO2 96 %. Body mass index is 23.63 kg/m. General appearance: alert, cooperative, appears stated age and mild distress Head: Normocephalic, atraumatic. Eyes: Blue eyes,  pink conjunctiva, corneas clear. PERRL, EOM's intact. Neck: No adenopathy, no carotid bruit, no JVD, supple, symmetrical, trachea midline and thyroid not enlarged. Resp: Clear to auscultation bilaterally. Cardio: Regular rate and rhythm, S1, S2 normal, II/VI systolic murmur, no click, rub or gallop GI: Soft, non-tender; bowel sounds normal; no organomegaly. Extremities: No edema, cyanosis or clubbing. Skin: Warm and dry.  Neurologic: Alert and oriented X 3, normal strength. Normal coordination.  Assessment/Plan Chest pain r/o MI CAD S/P LAD stent COPD Tobacco use disorder Alcohol use disorder Leg cramps  Agree with troponin I cycling. Nuclear stress test today or in AM.  Birdie Riddle, MD  07/03/2018, 9:45 AM

## 2018-07-03 NOTE — Progress Notes (Signed)
ANTICOAGULATION CONSULT NOTE - Initial Consult  Pharmacy Consult for heparin Indication: chest pain/ACS  Allergies  Allergen Reactions  . Codeine Nausea And Vomiting    Patient Measurements: Height: 5\' 9"  (175.3 cm) Weight: 160 lb (72.6 kg) IBW/kg (Calculated) : 70.7 Heparin Dosing Weight: 72.6  Vital Signs: Temp: 97.5 F (36.4 C) (07/28 0105) Temp Source: Oral (07/28 0105) BP: 135/78 (07/28 0900) Pulse Rate: 83 (07/28 0900)  Labs: Recent Labs    07/02/18 2318 07/03/18 0621  HGB 14.8 14.2  HCT 44.4 43.0  PLT 242 215  CREATININE 0.83 0.82  TROPONINI  --  <0.03    Estimated Creatinine Clearance: 103 mL/min (by C-G formula based on SCr of 0.82 mg/dL).   Medical History: Past Medical History:  Diagnosis Date  . Coronary artery disease     Medications:  Scheduled:  . aspirin EC  325 mg Oral Daily  . enoxaparin (LOVENOX) injection  40 mg Subcutaneous Q24H  . folic acid  1 mg Oral Daily  . iopamidol      . LORazepam  0-4 mg Intravenous Q6H   Followed by  . [START ON 07/05/2018] LORazepam  0-4 mg Intravenous Q12H  . multivitamin with minerals  1 tablet Oral Daily  . nitroGLYCERIN  1 inch Topical Q6H  . thiamine  100 mg Oral Daily   Or  . thiamine  100 mg Intravenous Daily   Infusions:   PRN: acetaminophen, LORazepam **OR** LORazepam, morphine injection, nitroGLYCERIN, ondansetron (ZOFRAN) IV  Assessment: 52 yo male with a PMH of CAD, LAD stent in 2013, COPD and tobacco use disorder presenting with chest pain. Pharmacy has been consulted to dose heparin for ACS/STEMI.  No anticoagulants prior to admission. CBC WNL.  Goal of Therapy:  Heparin level 0.3-0.7 units/ml Monitor platelets by anticoagulation protocol: Yes   Plan:  Give 4000 units bolus x 1 Start heparin infusion at 900 units/hr Check anti-Xa level in 6 hours and daily while on heparin Continue to monitor H&H and platelets  Danae OrleansMegan Madsen Riddle, PharmD PGY1 Pharmacy Resident Phone [phone]  [phone] 07/03/2018       10:08 AM

## 2018-07-03 NOTE — ED Notes (Signed)
Cardiology at bedside.

## 2018-07-03 NOTE — H&P (Signed)
History and Physical    Efosa Treichler ZOX:[id number] DOB: February 29, 1966 DOA: 07/02/2018  PCP: Patient, No Pcp Per  Patient coming from: Home.  Chief Complaint: Chest pain.  HPI: Lander Eslick is a 52 y.o. male with history of CAD status post stenting in 2013 and since then has not been following up with cardiologist or any other primary care physician presents to the ER with complaints with chest pain.  Patient has been a chest pain for last 2 to 3 months mostly in the left-sided anterior chest wall pressure-like last for few minutes.  But over the last 24 hours pain has become more persistent constant but at times having shortness of breath.  Denies any nausea vomiting abdominal pain productive cough fever or chills.  Over the last few days patient has been taking potassium pills due to muscle cramps involving the left thigh.  Denies any swelling in the thigh or the extremities.  ED Course: In the ER patient chest pain improved with pain relief medication but still persists.  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  Troponin was negative chest x-ray was showing COPD features.  Patient admitted for further management of chest pain concerning for angina.  Review of Systems: As per HPI, rest all negative.   Past Medical History:  Diagnosis Date  . Coronary artery disease     Past Surgical History:  Procedure Laterality Date  . CARDIAC CATHETERIZATION    . CORONARY ANGIOPLASTY    . LEFT HEART CATHETERIZATION WITH CORONARY ANGIOGRAM N/A 10/11/2012   Procedure: LEFT HEART CATHETERIZATION WITH CORONARY ANGIOGRAM;  Surgeon: Robynn Pane, MD;  Location: [facility];  Service: Cardiovascular;  Laterality: N/A;  . right arm surgery       reports that he has been smoking cigarettes.  He has a 30.00 pack-year smoking history. He has never used smokeless tobacco. He reports that he drinks alcohol. He reports that he does not use drugs.  Allergies  Allergen Reactions  . Codeine Nausea And  Vomiting    Family History  Problem Relation Age of Onset  . CAD Mother   . Diabetes Mellitus II Father   . Diabetes Mellitus II Sister     Prior to Admission medications   Medication Sig Start Date End Date Taking? Authorizing Provider  Potassium 99 MG TABS Take 99 mg by mouth daily.   Yes [provider]  aspirin EC 81 MG EC tablet Take 1 tablet (81 mg total) by mouth daily. Patient not taking: Reported on 07/03/2018 10/12/12   Rinaldo Cloud, MD  atorvastatin (LIPITOR) 80 MG tablet Take 1 tablet (80 mg total) by mouth daily at 6 PM. Patient not taking: Reported on 07/03/2018 10/12/12   Rinaldo Cloud, MD  metoprolol tartrate (LOPRESSOR) 25 MG tablet Take 1 tablet (25 mg total) by mouth 2 (two) times daily. Patient not taking: Reported on 07/03/2018 10/12/12   Rinaldo Cloud, MD  nitroGLYCERIN (NITROSTAT) 0.4 MG SL tablet Place 1 tablet (0.4 mg total) under the tongue every 5 (five) minutes x 3 doses as needed for chest pain. Patient not taking: Reported on 07/03/2018 10/12/12   Rinaldo Cloud, MD  Ticagrelor (BRILINTA) 90 MG TABS tablet Take 1 tablet (90 mg total) by mouth 2 (two) times daily. Patient not taking: Reported on 07/03/2018 10/12/12   Rinaldo Cloud, MD    Physical Exam: Vitals:   07/03/18 0500 07/03/18 0515 07/03/18 0530 07/03/18 0545  BP: 126/74  128/76   Pulse: 81 78 78 76  Resp: 17 14 15 14   Temp:      TempSrc:      SpO2: 96% 96% 94% 96%  Weight:      Height:          Constitutional: Moderately built and nourished. Vitals:   07/03/18 0500 07/03/18 0515 07/03/18 0530 07/03/18 0545  BP: 126/74  128/76   Pulse: 81 78 78 76  Resp: 17 14 15 14   Temp:      TempSrc:      SpO2: 96% 96% 94% 96%  Weight:      Height:       Eyes: Anicteric no pallor. ENMT: No discharge from the ears eyes nose or mouth. Neck: No mass felt.  No neck rigidity.  No JVD appreciated. Respiratory: No rhonchi or crepitations. Cardiovascular: S1-S2 heard no murmurs  appreciated. Abdomen: Soft nontender bowel sounds present. Musculoskeletal: No edema.  No joint effusion. Skin: No rash.  Skin appears warm. Neurologic: Alert awake oriented to time place and person.  Moves all extremities. Psychiatric: Appears normal per normal affect.   Labs on Admission: I have personally reviewed following labs and imaging studies  CBC: Recent Labs  Lab 07/02/18 2318  WBC 8.6  HGB 14.8  HCT 44.4  MCV 95.7  PLT 242   Basic Metabolic Panel: Recent Labs  Lab 07/02/18 2318  NA 135  K 4.1  CL 100  CO2 22  GLUCOSE 107*  BUN 8  CREATININE 0.83  CALCIUM 9.0   GFR: Estimated Creatinine Clearance: 101.7 mL/min (by C-G formula based on SCr of 0.83 mg/dL). Liver Function Tests: No results for input(s): AST, ALT, ALKPHOS, BILITOT, PROT, ALBUMIN in the last 168 hours. No results for input(s): LIPASE, AMYLASE in the last 168 hours. No results for input(s): AMMONIA in the last 168 hours. Coagulation Profile: No results for input(s): INR, PROTIME in the last 168 hours. Cardiac Enzymes: No results for input(s): CKTOTAL, CKMB, CKMBINDEX, TROPONINI in the last 168 hours. BNP (last 3 results) No results for input(s): PROBNP in the last 8760 hours. HbA1C: No results for input(s): HGBA1C in the last 72 hours. CBG: No results for input(s): GLUCAP in the last 168 hours. Lipid Profile: No results for input(s): CHOL, HDL, LDLCALC, TRIG, CHOLHDL, LDLDIRECT in the last 72 hours. Thyroid Function Tests: No results for input(s): TSH, T4TOTAL, FREET4, T3FREE, THYROIDAB in the last 72 hours. Anemia Panel: No results for input(s): VITAMINB12, FOLATE, FERRITIN, TIBC, IRON, RETICCTPCT in the last 72 hours. Urine analysis: No results found for: COLORURINE, APPEARANCEUR, LABSPEC, PHURINE, GLUCOSEU, HGBUR, BILIRUBINUR, KETONESUR, PROTEINUR, UROBILINOGEN, NITRITE, LEUKOCYTESUR Sepsis Labs: @LABRCNTIP (procalcitonin:4,lacticidven:4) )No results found for this or any previous  visit (from the past 240 hour(s)).   Radiological Exams on Admission: Dg Chest 2 View  Result Date: 07/03/2018 CLINICAL DATA:  Chest pain.  Central chest pain tonight. EXAM: CHEST - 2 VIEW COMPARISON:  Radiographs 11/10/2012 FINDINGS: Posterior costophrenic angles excluded from the field of view. Borderline hyperinflation. Normal heart size and mediastinal contours. No pulmonary edema, pneumothorax or focal airspace disease. No large pleural effusion. No acute osseous abnormalities are seen. IMPRESSION: Borderline hyperinflation suggesting asthma or COPD. No acute chest findings. Electronically Signed   By: Rubye Oaks M.D.   On: 07/03/2018 00:07    EKG: Independently reviewed.  Normal sinus rhythm with old anterior infarct.  Assessment/Plan Active Problems:   Chest pain   Tobacco abuse   Alcohol abuse    1. Chest pain with history of CAD status post stenting concerning  for angina -we will cycle cardiac markers check d-dimer aspirin nitroglycerin paste and have requested cardiology consult.  Will keep patient n.p.o. except medications.  LFTs and UDS is pending. 2. Myoclonic jerks and muscle cramps -while examining patient patient has been having some myoclonic jerks.  Also complains of left thigh muscle cramps.  For now patient is placed on CIWA protocol.  Closely observe.  Follow metabolic panel. 3. Alcohol abuse -patient states he drinks at least 5 times a week usually beer.  On CIWA protocol. 4. Tobacco abuse -tobacco cessation counseling requested.   DVT prophylaxis: Lovenox. Code Status: Full code. Family Communication: Discussed with patient's son. Disposition Plan: Home. Consults called: Cardiology. Admission status: Observation.   Eduard ClosArshad N Alandra Sando MD [facility] Pager [phone].  If 7PM-7AM, please contact night-coverage [email or web address] Password [email or web address]  07/03/2018, 5:59 AM

## 2018-07-03 NOTE — Progress Notes (Signed)
52 year old male with history of coronary artery disease status post PCI to LAD in 2013, alcohol abuse, admitted with chest pain and alcohol dependence syndrome.  Cardiac enzymes came back negative.  Cardiology team consulted.  Nuclear scan is said to be negative for acute ischemia.  Patient is currently being treated for alcohol dependence syndrome and withdrawal.  Further management will depend on hospital course.

## 2018-07-03 NOTE — ED Provider Notes (Addendum)
[facility] EMERGENCY DEPARTMENT Provider Note   CSN: [id number] Arrival date & time: 07/02/18  2245     History   Chief Complaint Chief Complaint  Patient presents with  . Chest Pain    HPI Blake Hughes is a 52 y.o. male.  HPI 52 year old male comes in with chief complaint of chest pain   Patient has a history of CAD and extensive smoking history.  Patient reports that over the past few weeks he has had intermittent episodes of left-sided chest tightness.  Patient had a severe episode of chest tightness this evening after dinner, which prompted him to come to the ER.  Chest tightness is located on the left side, it radiates towards the shoulder and the pain is similar to his MI pain. Today patient had associated shortness of breath and nausea.  Patient has not been seeing his cardiologist or taking his medications as prescribed because of insurance issues.  Pt has no hx of PE, DVT and denies any exogenous hormone (testosterone / estrogen) use, long distance travels or surgery in the past 6 weeks, active cancer, recent immobilization.   Past Medical History:  Diagnosis Date  . Coronary artery disease     Patient Active Problem List   Diagnosis Date Noted  . Chest pain 07/03/2018  . Tobacco abuse 07/03/2018  . Alcohol abuse 07/03/2018    Past Surgical History:  Procedure Laterality Date  . CARDIAC CATHETERIZATION    . CORONARY ANGIOPLASTY    . LEFT HEART CATHETERIZATION WITH CORONARY ANGIOGRAM N/A 10/11/2012   Procedure: LEFT HEART CATHETERIZATION WITH CORONARY ANGIOGRAM;  Surgeon: Robynn Pane, MD;  Location: [facility] CATH LAB;  Service: Cardiovascular;  Laterality: N/A;  . right arm surgery          Home Medications    Prior to Admission medications   Medication Sig Start Date End Date Taking? Authorizing Provider  Potassium 99 MG TABS Take 99 mg by mouth daily.   Yes [provider]  aspirin EC 81 MG EC tablet Take 1 tablet (81  mg total) by mouth daily. Patient not taking: Reported on 07/03/2018 10/12/12   Rinaldo Cloud, MD  atorvastatin (LIPITOR) 80 MG tablet Take 1 tablet (80 mg total) by mouth daily at 6 PM. Patient not taking: Reported on 07/03/2018 10/12/12   Rinaldo Cloud, MD  metoprolol tartrate (LOPRESSOR) 25 MG tablet Take 1 tablet (25 mg total) by mouth 2 (two) times daily. Patient not taking: Reported on 07/03/2018 10/12/12   Rinaldo Cloud, MD  nitroGLYCERIN (NITROSTAT) 0.4 MG SL tablet Place 1 tablet (0.4 mg total) under the tongue every 5 (five) minutes x 3 doses as needed for chest pain. Patient not taking: Reported on 07/03/2018 10/12/12   Rinaldo Cloud, MD  Ticagrelor (BRILINTA) 90 MG TABS tablet Take 1 tablet (90 mg total) by mouth 2 (two) times daily. Patient not taking: Reported on 07/03/2018 10/12/12   Rinaldo Cloud, MD    Family History Family History  Problem Relation Age of Onset  . CAD Mother   . Diabetes Mellitus II Father   . Diabetes Mellitus II Sister     Social History Social History   Tobacco Use  . Smoking status: Current Every Day Smoker    Packs/day: 1.00    Years: 30.00    Pack years: 30.00    Types: Cigarettes  . Smokeless tobacco: Never Used  Substance Use Topics  . Alcohol use: Yes    Comment: Drinks 5 times a week  beer.  . Drug use: No     Allergies   Codeine   Review of Systems Review of Systems  Constitutional: Positive for activity change.  Respiratory: Positive for chest tightness and shortness of breath.   Cardiovascular: Positive for chest pain. Negative for palpitations and leg swelling.  Gastrointestinal: Positive for nausea.  All other systems reviewed and are negative.    Physical Exam Updated Vital Signs BP 129/77   Pulse 76   Temp (!) 97.5 F (36.4 C) (Oral)   Resp 15   Ht 5\' 9"  (1.753 m)   Wt 72.6 kg (160 lb)   SpO2 93%   BMI 23.63 kg/m   Physical Exam  Constitutional: He is oriented to person, place, and time. He appears  well-developed.  HENT:  Head: Atraumatic.  Neck: Neck supple.  Cardiovascular: Normal rate, regular rhythm, intact distal pulses and normal pulses.  Pulmonary/Chest: Effort normal.  Musculoskeletal:       Right lower leg: He exhibits no tenderness and no edema.       Left lower leg: He exhibits tenderness. He exhibits no edema.  Neurological: He is alert and oriented to person, place, and time.  Skin: Skin is warm.  Nursing note and vitals reviewed.    ED Treatments / Results  Labs (all labs ordered are listed, but only abnormal results are displayed) Labs Reviewed  BASIC METABOLIC PANEL - Abnormal; Notable for the following components:      Result Value   Glucose, Bld 107 (*)    All other components within normal limits  CBC  HIV ANTIBODY (ROUTINE TESTING)  CBC  CREATININE, SERUM  TROPONIN I  TROPONIN I  TROPONIN I  D-DIMER, QUANTITATIVE (NOT AT [facility])  HEPATIC FUNCTION PANEL  LIPASE, BLOOD  RAPID URINE DRUG SCREEN, HOSP PERFORMED  I-STAT TROPONIN, ED  I-STAT TROPONIN, ED    EKG EKG Interpretation  Date/Time:  Sunday July 03 2018 03:16:33 EDT Ventricular Rate:  83 PR Interval:    QRS Duration: 79 QT Interval:  364 QTC Calculation: 428 R Axis:   72 Text Interpretation:  Sinus rhythm Probable anteroseptal infarct, old No acute changes No significant change since last tracing Confirmed by Derwood Kaplan [phone]) on 07/03/2018 3:53:43 AM   Radiology Dg Chest 2 View  Result Date: 07/03/2018 CLINICAL DATA:  Chest pain.  Central chest pain tonight. EXAM: CHEST - 2 VIEW COMPARISON:  Radiographs 11/10/2012 FINDINGS: Posterior costophrenic angles excluded from the field of view. Borderline hyperinflation. Normal heart size and mediastinal contours. No pulmonary edema, pneumothorax or focal airspace disease. No large pleural effusion. No acute osseous abnormalities are seen. IMPRESSION: Borderline hyperinflation suggesting asthma or COPD. No acute chest findings.  Electronically Signed   By: Rubye Oaks M.D.   On: 07/03/2018 00:07    Procedures Procedures (including critical care time)  CRITICAL CARE Performed by: Rakeen Gaillard   Total critical care time: 34 minutes  Critical care time was exclusive of separately billable procedures and treating other patients.  Critical care was necessary to treat or prevent imminent or life-threatening deterioration.  Critical care was time spent personally by me on the following activities: development of treatment plan with patient and/or surrogate as well as nursing, discussions with consultants, evaluation of patient's response to treatment, examination of patient, obtaining history from patient or surrogate, ordering and performing treatments and interventions, ordering and review of laboratory studies, ordering and review of radiographic studies, pulse oximetry and re-evaluation of patient's condition.   Medications Ordered in  ED Medications  nitroGLYCERIN (NITROGLYN) 2 % ointment 1 inch (1 inch Topical Given 07/03/18 0547)  nitroGLYCERIN (NITROSTAT) SL tablet 0.4 mg (has no administration in time range)  LORazepam (ATIVAN) tablet 1 mg (has no administration in time range)    Or  LORazepam (ATIVAN) injection 1 mg (has no administration in time range)  thiamine (VITAMIN B-1) tablet 100 mg (has no administration in time range)    Or  thiamine (B-1) injection 100 mg (has no administration in time range)  folic acid (FOLVITE) tablet 1 mg (has no administration in time range)  multivitamin with minerals tablet 1 tablet (has no administration in time range)  acetaminophen (TYLENOL) tablet 650 mg (has no administration in time range)  ondansetron (ZOFRAN) injection 4 mg (has no administration in time range)  LORazepam (ATIVAN) injection 0-4 mg (has no administration in time range)    Followed by  LORazepam (ATIVAN) injection 0-4 mg (has no administration in time range)  enoxaparin (LOVENOX) injection  40 mg (has no administration in time range)  morphine 4 MG/ML injection 2 mg (has no administration in time range)  aspirin EC tablet 325 mg (has no administration in time range)  aspirin chewable tablet 324 mg (324 mg Oral Given 07/03/18 0418)  cyclobenzaprine (FLEXERIL) tablet 10 mg (10 mg Oral Given 07/03/18 0419)     Initial Impression / Assessment and Plan / ED Course  I have reviewed the triage vital signs and the nursing notes.  Pertinent labs & imaging results that were available during my care of the patient were reviewed by me and considered in my medical decision making (see chart for details).     52 year old male comes in with chief complaint of left-sided chest tightness. Patient has known history of CAD.  He reports that his current chest pain is similar to his MI pain.  He is having active chest discomfort, it is not as intense as it was prior to ED arrival.  Bilateral radial pulse are equal.  We do not think patient is having dissection.  Additionally, we considered esophagitis, pneumothorax, pleurisy, PE in the differential diagnosis as well -however pretest probability for all of this condition is not as high as ACS.  Patient does not have insurance or follow-up.  We will admit him to the hospital for ACS rule out. Pt will be started on heparin.  Final Clinical Impressions(s) / ED Diagnoses   Final diagnoses:  Angina pectoris (HCC)  Unstable angina [facility](HCC)    ED Discharge Orders    None       Derwood KaplanNanavati, Mahala Rommel, MD 07/03/18 [id number]    Derwood KaplanNanavati, Diedre Maclellan, MD 07/03/18 [id number]

## 2018-07-03 NOTE — ED Notes (Signed)
Pt aware of need for urine sample, urinal provided. Warm blankets provided to pt and family

## 2018-07-03 NOTE — ED Notes (Signed)
Left for CT

## 2018-07-03 NOTE — Progress Notes (Signed)
ANTICOAGULATION CONSULT NOTE - Initial Consult  Pharmacy Consult for heparin Indication: chest pain/ACS  Allergies  Allergen Reactions  . Codeine Nausea And Vomiting   Patient Measurements: Height: 5\' 9"  (175.3 cm) Weight: 148 lb 11.2 oz (67.4 kg) IBW/kg (Calculated) : 70.7 Heparin Dosing Weight: 72.6  Vital Signs: Temp: 98.4 F (36.9 C) (07/28 1647) Temp Source: Oral (07/28 1647) BP: 142/85 (07/28 1647) Pulse Rate: 77 (07/28 1647)  Labs: Recent Labs    07/02/18 2318 07/03/18 0621 07/03/18 1310 07/03/18 1835  HGB 14.8 14.2  --   --   HCT 44.4 43.0  --   --   PLT 242 215  --   --   HEPARINUNFRC  --   --   --  0.32  CREATININE 0.83 0.82  --   --   TROPONINI  --  <0.03 <0.03 <0.03   Estimated Creatinine Clearance: 98.3 mL/min (by C-G formula based on SCr of 0.82 mg/dL).  Assessment: 52 yo male with a PMH of CAD, LAD stent in 2013 presenting with chest pain. Pharmacy has been consulted to dose heparin for ACS/STEMI. No AC PTA. CBC WNL. No bleeding noted.  Initial heparin level just within goal range at 0.32.  Goal of Therapy:  Heparin level 0.3-0.7 units/ml Monitor platelets by anticoagulation protocol: Yes   Plan:  Continue heparin infusion at 900 units/hr Re-check confirmatory heparin level in 6 hours Daily heparin level and CBC Monitor s/sx of bleeding  Danae OrleansMegan McCarthy, PharmD PGY1 Pharmacy Resident Phone [phone] 07/03/2018       7:34 PM

## 2018-07-04 ENCOUNTER — Other Ambulatory Visit: Payer: Self-pay

## 2018-07-04 DIAGNOSIS — Z720 Tobacco use: Secondary | ICD-10-CM

## 2018-07-04 DIAGNOSIS — F1010 Alcohol abuse, uncomplicated: Secondary | ICD-10-CM

## 2018-07-04 DIAGNOSIS — K852 Alcohol induced acute pancreatitis without necrosis or infection: Secondary | ICD-10-CM | POA: Diagnosis present

## 2018-07-04 DIAGNOSIS — I209 Angina pectoris, unspecified: Secondary | ICD-10-CM

## 2018-07-04 LAB — HEPARIN LEVEL (UNFRACTIONATED)
Heparin Unfractionated: 0.22 IU/mL — ABNORMAL LOW (ref 0.30–0.70)
Heparin Unfractionated: 0.44 IU/mL (ref 0.30–0.70)

## 2018-07-04 LAB — CBC
HCT: 42.2 % (ref 39.0–52.0)
Hemoglobin: 14.1 g/dL (ref 13.0–17.0)
MCH: 32.2 pg (ref 26.0–34.0)
MCHC: 33.4 g/dL (ref 30.0–36.0)
MCV: 96.3 fL (ref 78.0–100.0)
Platelets: 184 10*3/uL (ref 150–400)
RBC: 4.38 MIL/uL (ref 4.22–5.81)
RDW: 11.9 % (ref 11.5–15.5)
WBC: 6.9 10*3/uL (ref 4.0–10.5)

## 2018-07-04 MED ORDER — ATORVASTATIN CALCIUM 80 MG PO TABS: 80.0000 mg | ORAL_TABLET | Freq: Every day | ORAL | 0 refills | Status: DC

## 2018-07-04 MED ORDER — NITROGLYCERIN 0.4 MG SL SUBL: 0.4000 mg | SUBLINGUAL_TABLET | SUBLINGUAL | 0 refills | Status: DC | PRN

## 2018-07-04 MED ORDER — TICAGRELOR 90 MG PO TABS: 90.0000 mg | ORAL_TABLET | Freq: Two times a day (BID) | ORAL | 0 refills | Status: DC

## 2018-07-04 MED ORDER — METOPROLOL TARTRATE 25 MG PO TABS: 25.0000 mg | ORAL_TABLET | Freq: Two times a day (BID) | ORAL | 0 refills | Status: DC

## 2018-07-04 MED ORDER — HEPARIN BOLUS VIA INFUSION
2000.0000 [IU] | Freq: Once | INTRAVENOUS | Status: AC
  Administered 2018-07-04: 2000 [IU] via INTRAVENOUS
  Filled 2018-07-04: qty 2000

## 2018-07-04 MED FILL — ATORVASTATIN 80 MG TABLET: 80 | 30 days supply | Qty: 30 | Fill #0

## 2018-07-04 MED FILL — NITROGLYCERIN 0.4 MG TAB SL: 0.4 | 10 days supply | Qty: 25 | Fill #0

## 2018-07-04 MED FILL — METOPROLOL TARTRATE 25 MG T: 25 | 30 days supply | Qty: 60 | Fill #0

## 2018-07-04 NOTE — Progress Notes (Signed)
ANTICOAGULATION CONSULT NOTE - Follow Up Consult  Pharmacy Consult for heparin Indication: chest pain/ACS  Labs: Recent Labs    07/02/18 2318 07/03/18 0621 07/03/18 1310 07/03/18 1835 07/04/18 0044  HGB 14.8 14.2  --   --  14.1  HCT 44.4 43.0  --   --  42.2  PLT 242 215  --   --  184  HEPARINUNFRC  --   --   --  0.32 0.22*  CREATININE 0.83 0.82  --   --   --   TROPONINI  --  <0.03 <0.03 <0.03  --     Assessment: 52yo male now subtherapeutic on heparin after one level at lower end of goal; no gtt issues per RN, first level likely due to bolus.  Goal of Therapy:  Heparin level 0.3-0.7 units/ml   Plan:  Will rebolus with heparin 2000 units and increase heparin gtt by 3 units/kg/hr to 1100 units/hr and check level in 6 hours.    Vernard GamblesVeronda Lamarcus Spira, PharmD, BCPS  07/04/2018,1:56 AM

## 2018-07-04 NOTE — Discharge Summary (Signed)
DISCHARGE SUMMARY  Blake Hughes  MR#: [id number]  DOB:04-10-1966  Date of Admission: 07/02/2018 Date of Discharge: 07/04/2018  Attending Physician:Karaline Buresh Silvestre Gunner, MD  Patient's WUJ:[id number], No Pcp Per  Consults:  Cardiology   Disposition: D/C home   Follow-up Appts: Follow-up Information    [facility]. Schedule an appointment as soon as possible for a visit in 2 week(s).   Contact information: [address] [place] [address] [phone]         Discharge Diagnoses: Chest pain  Pancreatitis  EtOH abuse  Tobacco abuse  Hx of CAD   Initial presentation: 52 y.o. male with history of EtOH abuse and CAD status post stenting in 2013 and since then has not been following up with a Cardiologist or any other MD who presented to the ER with complaints with chest pain for 2-3 months mostly in the left-sided anterior chest wall pressure-like lasting for few minutes.    In the ER his chest pain improved with pain relief medication.  EKG showed old anterior septal infarct.  Troponin was negative chest x-ray noted COPD features.   Hospital Course: The patient was admitted to the acute units for evaluation of his chest pain.  Cardiology was consulted.  The patient underwent a nuclear stress test which revealed no findings suggestive of acute ischemia.  Serial troponins were unrevealing.  No further inpatient testing was felt to be indicated.  His pain had essentially resolved at the time of his discharge.  He was awake alert and tolerating regular intake without difficulty.  Ultimately it was felt that his symptoms were likely related to pancreatitis in the setting of ongoing alcohol abuse.  This would explain improvement of his symptoms in the absence of alcohol during his hospital stay.  He has been advised to refrain from further alcohol use.  Allergies as of 07/04/2018      Reactions   Codeine Nausea And Vomiting        Medication List    STOP taking these medications   Potassium 99 MG Tabs     TAKE these medications   aspirin 81 MG EC tablet Take 1 tablet (81 mg total) by mouth daily.   atorvastatin 80 MG tablet Commonly known as:  LIPITOR Take 1 tablet (80 mg total) by mouth daily at 6 PM.   metoprolol tartrate 25 MG tablet Commonly known as:  LOPRESSOR Take 1 tablet (25 mg total) by mouth 2 (two) times daily.   nitroGLYCERIN 0.4 MG SL tablet Commonly known as:  NITROSTAT Place 1 tablet (0.4 mg total) under the tongue every 5 (five) minutes x 3 doses as needed for chest pain.   ticagrelor 90 MG Tabs tablet Commonly known as:  BRILINTA Take 1 tablet (90 mg total) by mouth 2 (two) times daily.       Day of Discharge BP (!) 142/86 (BP Location: Right Arm)   Pulse 78   Temp 98 F (36.7 C) (Oral)   Resp 18   Ht 5\' 9"  (1.753 m)   Wt 67.7 kg (149 lb 3.2 oz)   SpO2 96%   BMI 22.03 kg/m   Physical Exam: General: No acute respiratory distress Lungs: Clear to auscultation bilaterally without wheezes or crackles Cardiovascular: Regular rate and rhythm without murmur gallop or rub normal S1 and S2 Abdomen: Nontender, nondistended, soft, bowel sounds positive, no rebound, no ascites, no appreciable mass Extremities: No significant cyanosis, clubbing, or edema bilateral lower extremities  Basic Metabolic  Panel: Recent Labs  Lab 07/02/18 2318 07/03/18 0621  NA 135  --   K 4.1  --   CL 100  --   CO2 22  --   GLUCOSE 107*  --   BUN 8  --   CREATININE 0.83 0.82  CALCIUM 9.0  --     Liver Function Tests: Recent Labs  Lab 07/03/18 0621  AST 25  ALT 26  ALKPHOS 61  BILITOT 0.6  PROT 6.9  ALBUMIN 4.0   Recent Labs  Lab 07/03/18 0621  LIPASE 98*   CBC: Recent Labs  Lab 07/02/18 2318 07/03/18 0621 07/04/18 0044  WBC 8.6 5.0 6.9  HGB 14.8 14.2 14.1  HCT 44.4 43.0 42.2  MCV 95.7 97.1 96.3  PLT 242 215 184    Cardiac Enzymes: Recent Labs  Lab 07/03/18 0621  07/03/18 1310 07/03/18 1835  TROPONINI <0.03 <0.03 <0.03     Recent Results (from the past 240 hour(s))  MRSA PCR Screening     Status: None   Collection Time: 07/03/18 12:49 PM  Result Value Ref Range Status   MRSA by PCR NEGATIVE NEGATIVE Final    Comment:        The GeneXpert MRSA Assay (FDA approved for NASAL specimens only), is one component of a comprehensive MRSA colonization surveillance program. It is not intended to diagnose MRSA infection nor to guide or monitor treatment for MRSA infections. Performed at [facility], [address] [place], [place] [address]       Time spent in discharge (includes decision making & examination of pt): 30 minutes  07/04/2018, 11:18 AM   Lonia BloodJeffrey T. Katelinn Justice, MD [facility] Office  [phone] Pager [phone]  On-Call/Text Page:      [email or web address]      password [email or web address]

## 2018-07-04 NOTE — Progress Notes (Signed)
Subjective:  Patient denies any chest pain or shortness of breath.  Patient had nuclear stress test yesterday which showed no evidence of reversible ischemia with normal ejection fraction.  Objective:  Vital Signs in the last 24 hours: Temp:  [98 F (36.7 C)-98.6 F (37 C)] 98 F (36.7 C) (07/29 16100822) Pulse Rate:  [70-96] 78 (07/29 0822) Resp:  [13-21] 18 (07/29 0822) BP: (126-151)/(70-91) 142/86 (07/29 0822) SpO2:  [96 %-98 %] 96 % (07/29 0822) Weight:  [67.4 kg (148 lb 11.2 oz)-67.7 kg (149 lb 3.2 oz)] 67.7 kg (149 lb 3.2 oz) (07/29 0520)  Intake/Output from previous day: 07/28 0701 - 07/29 0700 In: 102.9 [I.V.:102.9] Out: -  Intake/Output from this shift: Total I/O In: 240 [P.O.:240] Out: -   Physical Exam: Neck: no adenopathy, no carotid bruit, no JVD and supple, symmetrical, trachea midline Lungs: clear to auscultation bilaterally Heart: regular rate and rhythm, S1, S2 normal and soft systolic murmur noted Abdomen: soft, non-tender; bowel sounds normal; no masses,  no organomegaly Extremities: extremities normal, atraumatic, no cyanosis or edema  Lab Results: Recent Labs    07/03/18 0621 07/04/18 0044  WBC 5.0 6.9  HGB 14.2 14.1  PLT 215 184   Recent Labs    07/02/18 2318 07/03/18 0621  NA 135  --   K 4.1  --   CL 100  --   CO2 22  --   GLUCOSE 107*  --   BUN 8  --   CREATININE 0.83 0.82   Recent Labs    07/03/18 1310 07/03/18 1835  TROPONINI <0.03 <0.03   Hepatic Function Panel Recent Labs    07/03/18 0621  PROT 6.9  ALBUMIN 4.0  AST 25  ALT 26  ALKPHOS 61  BILITOT 0.6  BILIDIR <0.1  IBILI NOT CALCULATED   No results for input(s): CHOL in the last 72 hours. No results for input(s): PROTIME in the last 72 hours.  Imaging: Imaging results have been reviewed and Dg Chest 2 View  Result Date: 07/03/2018 CLINICAL DATA:  Chest pain.  Central chest pain tonight. EXAM: CHEST - 2 VIEW COMPARISON:  Radiographs 11/10/2012 FINDINGS: Posterior  costophrenic angles excluded from the field of view. Borderline hyperinflation. Normal heart size and mediastinal contours. No pulmonary edema, pneumothorax or focal airspace disease. No large pleural effusion. No acute osseous abnormalities are seen. IMPRESSION: Borderline hyperinflation suggesting asthma or COPD. No acute chest findings. Electronically Signed   By: Rubye OaksMelanie  Ehinger M.D.   On: 07/03/2018 00:07   Ct Angio Chest Pe W Or Wo Contrast  Result Date: 07/03/2018 CLINICAL DATA:  Chest pain.  Dyspnea.  Elevated D-dimer. EXAM: CT ANGIOGRAPHY CHEST WITH CONTRAST TECHNIQUE: Multidetector CT imaging of the chest was performed using the standard protocol during bolus administration of intravenous contrast. Multiplanar CT image reconstructions and MIPs were obtained to evaluate the vascular anatomy. CONTRAST:  100mL ISOVUE-370 IOPAMIDOL (ISOVUE-370) INJECTION 76% COMPARISON:  Chest radiograph from one day prior. FINDINGS: Cardiovascular: The study is high quality for the evaluation of pulmonary embolism. There are no filling defects in the central, lobar, segmental or subsegmental pulmonary artery branches to suggest acute pulmonary embolism. Atherosclerotic nonaneurysmal thoracic aorta. Normal caliber pulmonary arteries. Normal heart size. No significant pericardial fluid/thickening. Left anterior descending and right coronary atherosclerosis. Mediastinum/Nodes: No discrete thyroid nodules. Unremarkable esophagus. No pathologically enlarged axillary, mediastinal or hilar lymph nodes. Lungs/Pleura: No pneumothorax. No pleural effusion. No acute consolidative airspace disease or lung masses. Right middle lobe solid 3 mm pulmonary nodule (  series 7/image 62). Mild hypoventilatory changes in the dependent lower lobes. Diffuse bronchial wall thickening. Upper abdomen: No acute abnormality. Musculoskeletal: No aggressive appearing focal osseous lesions. Mild thoracic spondylosis. Review of the MIP images confirms  the above findings. IMPRESSION: 1. No pulmonary embolism. 2. Three-vessel coronary atherosclerosis. 3. Right middle lobe 3 mm pulmonary nodule. No follow-up needed if patient is low-risk. Non-contrast chest CT can be considered in 12 months if patient is high-risk. This recommendation follows the consensus statement: Guidelines for Management of Incidental Pulmonary Nodules Detected on CT Images:From the Fleischner Society 2017; published online before print (10.1148/radiol.1610960454). Aortic Atherosclerosis (ICD10-I70.0). Electronically Signed   By: Delbert Phenix M.D.   On: 07/03/2018 10:35   Nm Myocar Multi W/spect W/wall Motion / Ef  Result Date: 07/03/2018 CLINICAL DATA:  Acute coronary syndrome. History positive coronary artery disease, smoking, alcohol use, and family history of heart disease. EXAM: MYOCARDIAL IMAGING WITH SPECT (REST AND PHARMACOLOGIC-STRESS) GATED LEFT VENTRICULAR WALL MOTION STUDY LEFT VENTRICULAR EJECTION FRACTION TECHNIQUE: Standard myocardial SPECT imaging was performed after resting intravenous injection of 10 mCi Tc-37m tetrofosmin. Subsequently, intravenous infusion of Lexiscan was performed under the supervision of the Cardiology staff. At peak effect of the drug, 30 mCi Tc-89m tetrofosmin was injected intravenously and standard myocardial SPECT imaging was performed. Quantitative gated imaging was also performed to evaluate left ventricular wall motion, and estimate left ventricular ejection fraction. COMPARISON:  CTA chest 07/03/2018 FINDINGS: Perfusion: There is decreased uptake involving the proximal septum and base suggesting a remote infarct. No reversible perfusion is evident. Wall Motion: Decreased wall motion is evident along the septum. Wall motion and thickening is otherwise normal. Left Ventricular Ejection Fraction: 61 % End diastolic volume 82 ml End systolic volume 32 ml IMPRESSION: 1. 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 No acute ischemia. 2.  Hypokinesis along the septum. 3. Left ventricular ejection fraction 61% 4. Non invasive risk stratification*: Low *2012 Appropriate Use Criteria for Coronary Revascularization Focused Update: J Am Coll Cardiol. 2012;59(9):857-881. http://content.dementiazones.com.aspx?articleid=[email or web address] Electronically Signed   By: Marin Roberts M.D.   On: 07/03/2018 12:55    Cardiac Studies:  Assessment/Plan:  Stable angina.negative nuclear stress test as above Multivessel moderate CAD, status post PCI to LAD in the past. Hypertension Hyperlipidemia COPD Tobacco abuse. History of EtOH abuse. Plan Okay to discharge from cardiac point of view. Discussed at length with patient regarding compliance with medication and diet and lifestyle changes. Follow-up with me in one week  LOS: 0 days    Rinaldo Cloud 07/04/2018, 12:13 PM

## 2018-07-04 NOTE — Progress Notes (Signed)
ANTICOAGULATION CONSULT NOTE Pharmacy Consult for heparin Indication: chest pain/ACS  Allergies  Allergen Reactions  . Codeine Nausea And Vomiting   Patient Measurements: Height: 5\' 9"  (175.3 cm) Weight: 149 lb 3.2 oz (67.7 kg) IBW/kg (Calculated) : 70.7 Heparin Dosing Weight: 72.6  Vital Signs: Temp: 98 F (36.7 C) (07/29 0822) Temp Source: Oral (07/29 0822) BP: 142/86 (07/29 0822) Pulse Rate: 78 (07/29 0822)  Labs: Recent Labs    07/02/18 2318 07/03/18 0621 07/03/18 1310 07/03/18 1835 07/04/18 0044 07/04/18 0725  HGB 14.8 14.2  --   --  14.1  --   HCT 44.4 43.0  --   --  42.2  --   PLT 242 215  --   --  184  --   HEPARINUNFRC  --   --   --  0.32 0.22* 0.44  CREATININE 0.83 0.82  --   --   --   --   TROPONINI  --  <0.03 <0.03 <0.03  --   --    Estimated Creatinine Clearance: 98.6 mL/min (by C-G formula based on SCr of 0.82 mg/dL).  Assessment: 52 yo male with a PMH of CAD, LAD stent in 2013 presenting with chest pain. Pharmacy has been consulted to dose heparin for ACS/STEMI. No AC PTA. CBC WNL. No bleeding noted.  Heparin level therapeutic now, nuclear stress test negative?  Goal of Therapy:  Heparin level 0.3-0.7 units/ml Monitor platelets by anticoagulation protocol: Yes   Plan:  Continue heparin at 1100 units / hr Follow up AM labs  Thank you Okey RegalLisa Wadsworth Skolnick, PharmD [phone] 07/04/2018       9:21 AM

## 2018-07-04 NOTE — Discharge Instructions (Signed)
Acute Pancreatitis °Acute pancreatitis is a condition in which the pancreas suddenly becomes irritated and swollen (has inflammation). The pancreas is a gland that is located behind the stomach. It produces enzymes that help to digest food. The pancreas also releases the hormones glucagon and insulin, which help to regulate blood sugar. Damage to the pancreas occurs when the digestive enzymes from the pancreas are activated before they are released into the intestine. °Most acute attacks last a couple of days and can cause serious problems. Some people become dehydrated and develop low blood pressure. In severe cases, bleeding into the pancreas can lead to shock and can be life-threatening. The lungs, heart, and kidneys may fail. °What are the causes? °The most common causes of this condition are: °· Alcohol abuse. °· Gallstones. ° °Other causes include: °· Certain medicines. °· Exposure to certain chemicals. °· Infection. °· Damage caused by an accident (trauma). °· Abdominal surgery. ° °In some cases, the cause may not be known. °What are the signs or symptoms? °Symptoms of this condition include: °· Pain in the upper abdomen that may radiate to the back. °· Tenderness and swelling of the abdomen. °· Nausea and vomiting. ° °How is this diagnosed? °This condition may be diagnosed based on: °· A physical exam. °· Blood tests. °· Imaging tests, such as X-rays, CT scans, or an ultrasound of the abdomen. ° °How is this treated? °Treatment for this condition usually requires a stay in the hospital. Treatment may include: °· Pain medicine. °· Fluid replacement through an IV tube. °· Placing a tube in the stomach to remove stomach contents and to control vomiting (NG tube, or nasogastric tube). °· Not eating for 3-4 days. This gives the pancreas a rest, because enzymes are not being produced that can cause further damage. °· Antibiotic medicines, if your condition is caused by an infection. °· Surgery on the pancreas or  gallbladder. ° °Follow these instructions at home: °Eating and drinking °· Follow instructions from your health care provider about diet. This may involve avoiding alcohol and decreasing the amount of fat in your diet. °· Eat smaller, more frequent meals. This reduces the amount of digestive fluids that the pancreas produces. °· Drink enough fluid to keep your urine clear or pale yellow. °· Do not drink alcohol if it caused your condition. °General instructions °· Take over-the-counter and prescription medicines only as told by your health care provider. °· Do not use any tobacco products, such as cigarettes, chewing tobacco, and e-cigarettes. If you need help quitting, ask your health care provider. °· Get plenty of rest. °· If directed, check your blood sugar at home as told by your health care provider. °· Keep all follow-up visits as told by your health care provider. This is important. °Contact a health care provider if: °· You do not recover as quickly as expected. °· You develop new or worsening symptoms. °· You have persistent pain, weakness, or nausea. °· You recover and then have another episode of pain. °· You have a fever. °Get help right away if: °· You cannot eat or keep fluids down. °· Your pain becomes severe. °· Your skin or the white part of your eyes turns yellow (jaundice). °· You vomit. °· You feel dizzy or you faint. °· Your blood sugar is high (over 300 mg/dL). °This information is not intended to replace advice given to you by your health care provider. Make sure you discuss any questions you have with your health care provider. °Document Released:   11/23/2005 Document Revised: 04/01/2016 Document Reviewed: 08/27/2015 Elsevier Interactive Patient Education  2018 [employer]   Alcohol Use Disorder Alcohol use disorder is when your drinking disrupts your daily life. When you have this condition, you drink too much alcohol and you cannot control your drinking. Alcohol use disorder can  cause serious problems with your physical health. It can affect your brain, heart, liver, pancreas, immune system, stomach, and intestines. Alcohol use disorder can increase your risk for certain cancers and cause problems with your mental health, such as depression, anxiety, psychosis, delirium, and dementia. People with this disorder risk hurting themselves and others. What are the causes? This condition is caused by drinking too much alcohol over time. It is not caused by drinking too much alcohol only one or two times. Some people with this condition drink alcohol to cope with or escape from negative life events. Others drink to relieve pain or symptoms of mental illness. What increases the risk? You are more likely to develop this condition if:  You have a family history of alcohol use disorder.  Your culture encourages drinking to the point of intoxication, or makes alcohol easy to get.  You had a mood or conduct disorder in childhood.  You have been a victim of abuse.  You are an adolescent and: ? You have poor grades or difficulties in school. ? Your caregivers do not talk to you about saying no to alcohol, or supervise your activities. ? You are impulsive or you have trouble with self-control.  What are the signs or symptoms? Symptoms of this condition include:  Drinkingmore than you want to.  Drinking for longer than you want to.  Trying several times to drink less or to control your drinking.  Spending a lot of time getting alcohol, drinking, or recovering from drinking.  Craving alcohol.  Having problems at work, at school, or at home due to drinking.  Having problems in relationships due to drinking.  Drinking when it is dangerous to drink, such as before driving a car.  Continuing to drink even though you know you might have a physical or mental problem related to drinking.  Needing more and more alcohol to get the same effect you want from the alcohol (building  up tolerance).  Having symptoms of withdrawal when you stop drinking. Symptoms of withdrawal include: ? Fatigue. ? Nightmares. ? Trouble sleeping. ? Depression. ? Anxiety. ? Fever. ? Seizures. ? Severe confusion. ? Feeling or seeing things that are not there (hallucinations). ? Tremors. ? Rapid heart rate. ? Rapid breathing. ? High blood pressure.  Drinking to avoid symptoms of withdrawal.  How is this diagnosed? This condition is diagnosed with an assessment. Your health care provider may start the assessment by asking three or four questions about your drinking. Your health care provider may perform a physical exam or do lab tests to see if you have physical problems resulting from alcohol use. She or he may refer you to a mental health professional for evaluation. How is this treated? Some people with alcohol use disorder are able to reduce their alcohol use to low-risk levels. Others need to completely quit drinking alcohol. When necessary, mental health professionals with specialized training in substance use treatment can help. Your health care provider can help you decide how severe your alcohol use disorder is and what type of treatment you need. The following forms of treatment are available:  Detoxification. Detoxification involves quitting drinking and using prescription medicines within the first week to  help lessen withdrawal symptoms. This treatment is important for people who have had withdrawal symptoms before and for heavy drinkers who are likely to have withdrawal symptoms. Alcohol withdrawal can be dangerous, and in severe cases, it can cause death. Detoxification may be provided in a home, community, or primary care setting, or in a hospital or substance use treatment facility.  Counseling. This treatment is also called talk therapy. It is provided by substance use treatment counselors. A counselor can address the reasons you use alcohol and suggest ways to keep you  from drinking again or to prevent problem drinking. The goals of talk therapy are to: ? Find healthy activities and ways for you to cope with stress. ? Identify and avoid the things that trigger your alcohol use. ? Help you learn how to handle cravings.  Medicines.Medicines can help treat alcohol use disorder by: ? Decreasing alcohol cravings. ? Decreasing the positive feeling you have when you drink alcohol. ? Causing an uncomfortable physical reaction when you drink alcohol (aversion therapy).  Support groups. Support groups are led by people who have quit drinking. They provide emotional support, advice, and guidance.  These forms of treatment are often combined. Some people with this condition benefit from a combination of treatments provided by specialized substance use treatment centers. Follow these instructions at home:  Take over-the-counter and prescription medicines only as told by your health care provider.  Check with your health care provider before starting any new medicines.  Ask friends and family members not to offer you alcohol.  Avoid situations where alcohol is served, including gatherings where others are drinking alcohol.  Create a plan for what to do when you are tempted to use alcohol.  Find hobbies or activities that you enjoy that do not include alcohol.  Keep all follow-up visits as told by your health care provider. This is important. How is this prevented?  If you drink, limit alcohol intake to no more than 1 drink a day for nonpregnant women and 2 drinks a day for men. One drink equals 12 oz of beer, 5 oz of wine, or 1 oz of hard liquor.  If you have a mental health condition, get treatment and support.  Do not give alcohol to adolescents.  If you are an adolescent: ? Do not drink alcohol. ? Do not be afraid to say no if someone offers you alcohol. Speak up about why you do not want to drink. You can be a positive role model for your friends and  set a good example for those around you by not drinking alcohol. ? If your friends drink, spend time with others who do not drink alcohol. Make new friends who do not use alcohol. ? Find healthy ways to manage stress and emotions, such as meditation or deep breathing, exercise, spending time in nature, listening to music, or talking with a trusted friend or family member. Contact a health care provider if:  You are not able to take your medicines as told.  Your symptoms get worse.  You return to drinking alcohol (relapse) and your symptoms get worse. Get help right away if:  You have thoughts about hurting yourself or others. If you ever feel like you may hurt yourself or others, or have thoughts about taking your own life, get help right away. You can go to your nearest emergency department or call:  Your local emergency services (911 in the [place]  A suicide crisis helpline, such as the [employer] Suicide Prevention Lifeline  at 5393058081. This is open 24 hours a day.  Summary  Alcohol use disorder is when your drinking disrupts your daily life. When you have this condition, you drink too much alcohol and you cannot control your drinking.  Treatment may include detoxification, counseling, medicine, and support groups.  Ask friends and family members not to offer you alcohol. Avoid situations where alcohol is served.  Get help right away if you have thoughts about hurting yourself or others. This information is not intended to replace advice given to you by your health care provider. Make sure you discuss any questions you have with your health care provider. Document Released: 12/31/2004 Document Revised: 08/20/2016 Document Reviewed: 08/20/2016 Elsevier Interactive Patient Education  [employer].

## 2018-07-04 NOTE — Care Management Note (Addendum)
Case Management Note  Patient Details  Name: Blake Hughes MRN: [id number] Date of Birth: May 10, 1966  Subjective/Objective: Pt presented for chest pain- s/p stress test. Pt states he has been without medications since 2013 because so expensive. Pt states he does work however not able to afford the cost for [employer]ilinta.                    Action/Plan: Pt had previously used the 30 day free card in the past and did not have assistance with the patient assistance application. [facility] an [facility]- no appointments available and patient will need to call the Clinic on 07-07-18 to see if can get established with provider. Patient will be able to utilize the clinic pharmacy and additional medications can be [facility] from $4.00-$10.00. Brilinta Samples provided to patient. No further needs from CM at this time.   Expected Discharge Date:  07/04/18               Expected Discharge Plan:  Home/Self Care  In-House Referral:  Clinical Social Work  Discharge planning Services  CM Consult, Medication Assistance, Follow-up appt scheduled, [facility]  Post Acute Care Choice:  NA Choice offered to:  NA  DME Arranged:  N/A DME Agency:  NA  HH Arranged:  NA HH Agency:  NA  Status of Service:  Completed, signed off  If discussed at Long Length of Stay Meetings, dates discussed:    Additional Comments:  [name], [name], RN 07/04/2018, 12:11 PM

## 2020-01-09 IMAGING — CT CT ANGIO CHEST
2 of 8 series · 18 of 36 positions shown · IV contrast (OMNI)
Comparison: Chest radiograph from one day prior.

CLINICAL DATA: Chest pain.  Dyspnea.  Elevated D-dimer.

EXAM:
CT ANGIOGRAPHY CHEST WITH CONTRAST
TECHNIQUE: Multidetector CT imaging of the chest was performed using the
standard protocol during bolus administration of intravenous
contrast. Multiplanar CT image reconstructions and MIPs were
obtained to evaluate the vascular anatomy.
CONTRAST:  100mL A7RKT0-3FR IOPAMIDOL (A7RKT0-3FR) INJECTION 76%

[Series 6: thins · axial · 0.71mm/px · z∈[+1108,+1408]mm · 17 of 336 slices shown]
[im 18/336  lung]
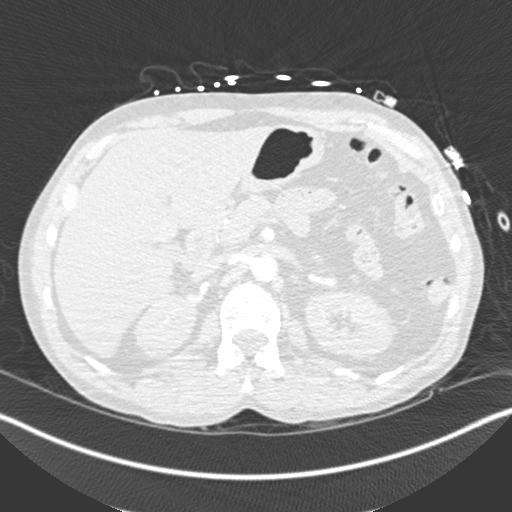
[im 36/336  mediastinal]
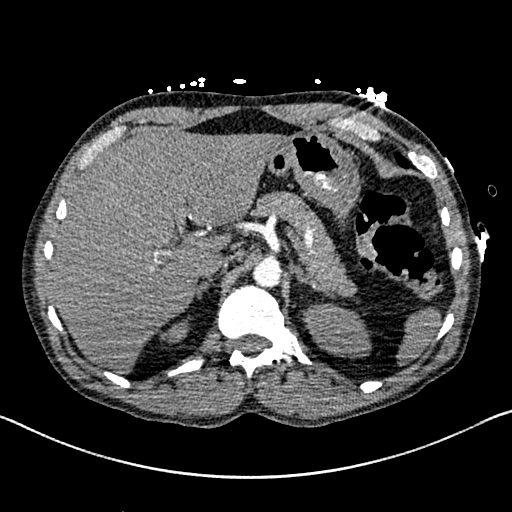
[im 53/336  lung]
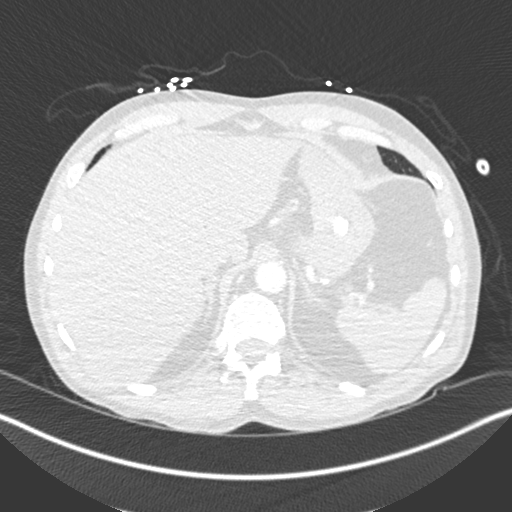
[im 71/336  mediastinal]
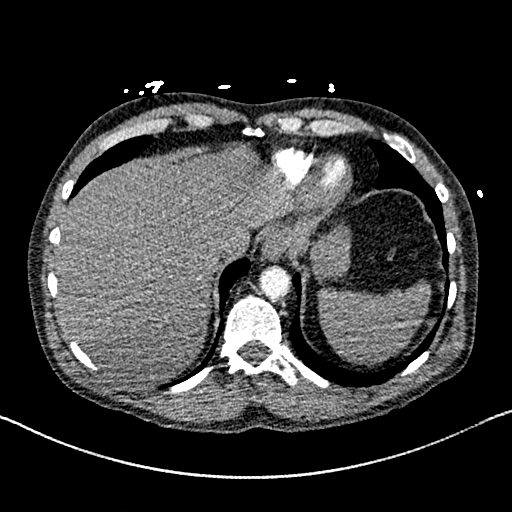
[im 89/336  lung]
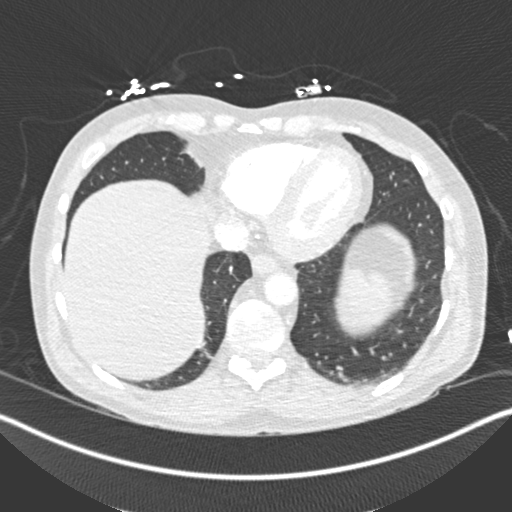
[im 106/336  mediastinal]
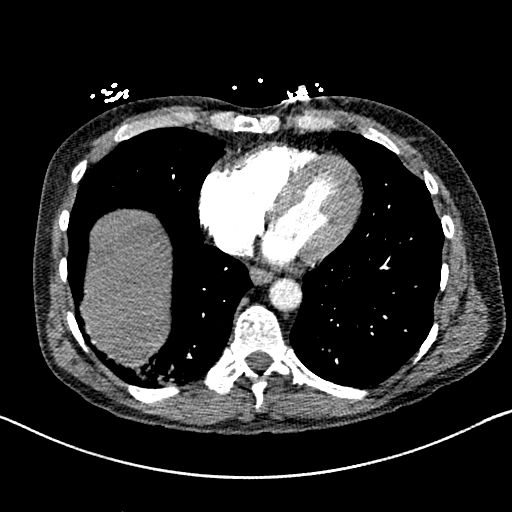
[im 124/336  lung]
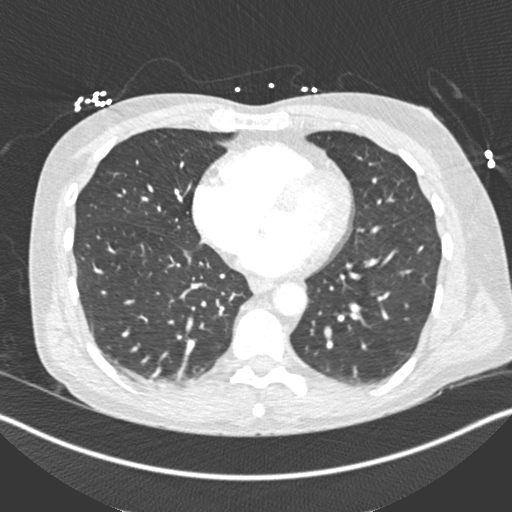
[im 142/336  mediastinal]
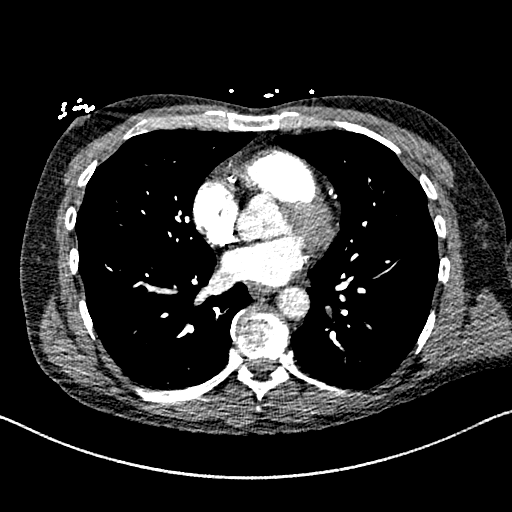
[im 177/336  lung]
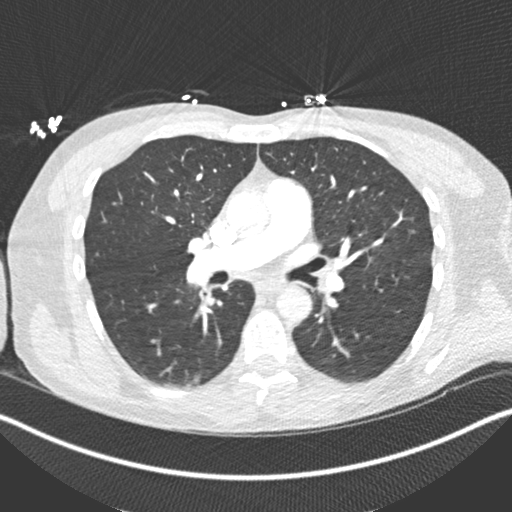
[im 194/336  mediastinal]
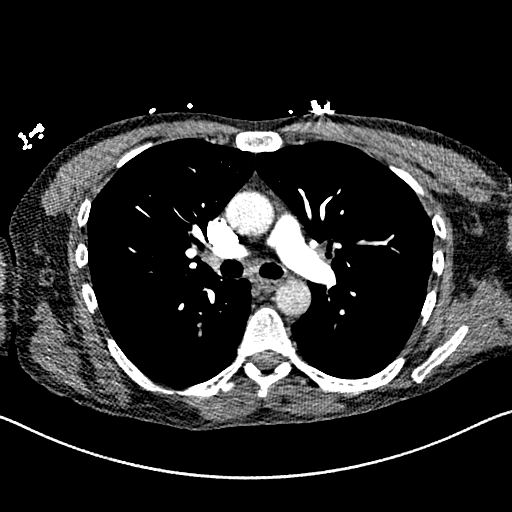
[im 212/336  lung]
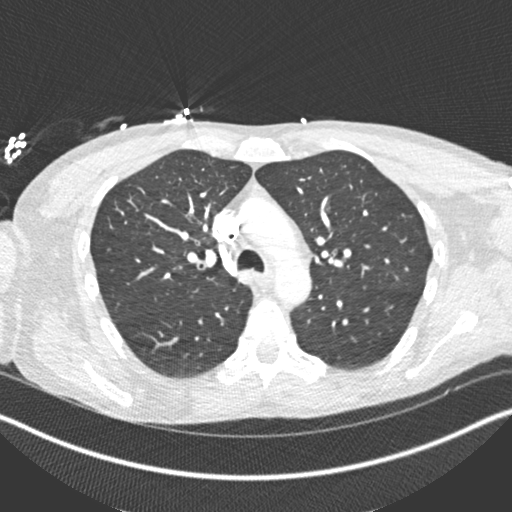
[im 230/336  mediastinal]
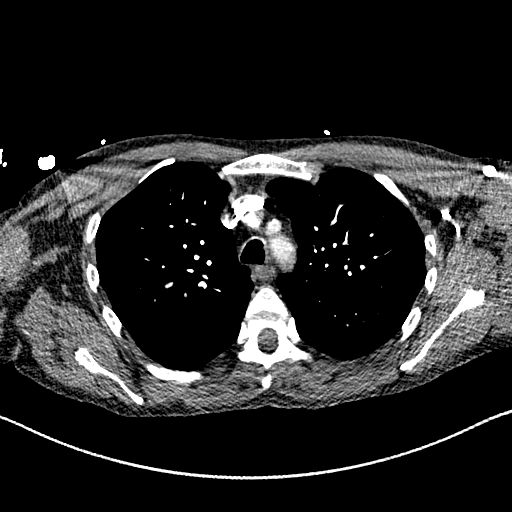
[im 247/336  lung]
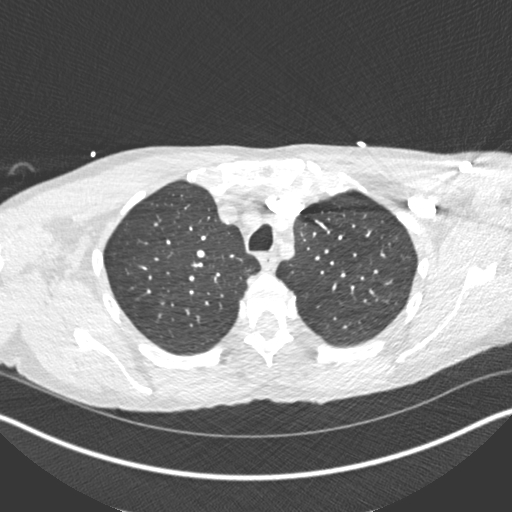
[im 265/336  mediastinal]
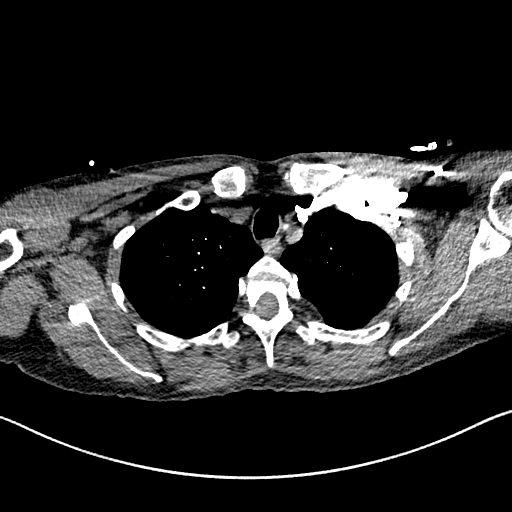
[im 283/336  lung]
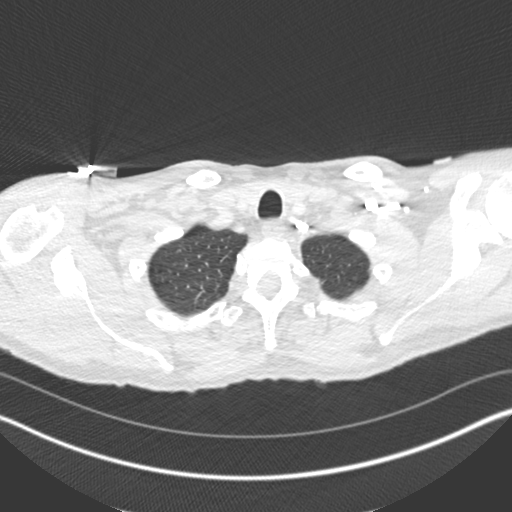
[im 300/336  mediastinal]
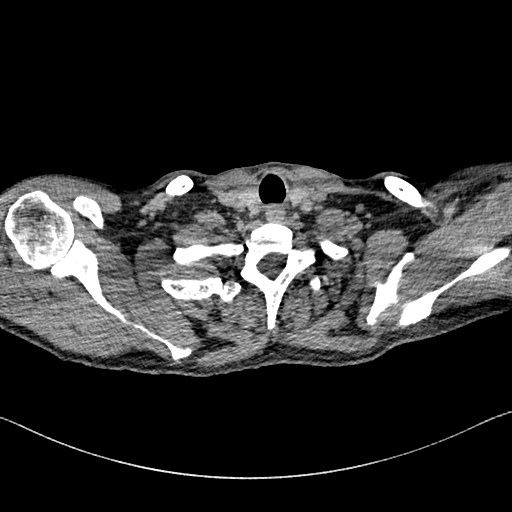
[im 318/336  lung]
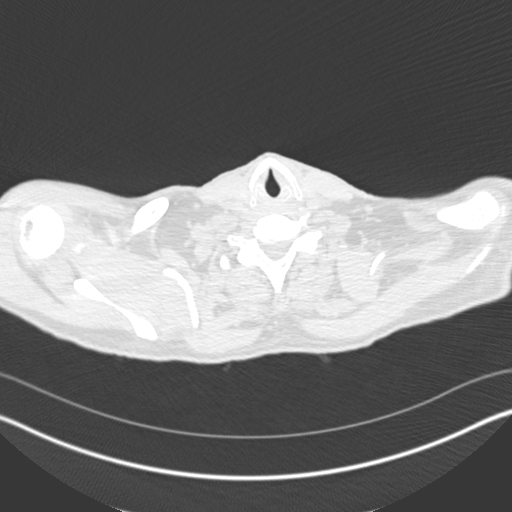

[Series 8: coronal mpr · coronal · 0.67mm/px · 1 of 155 slices shown]
[im 78/155  mediastinal]
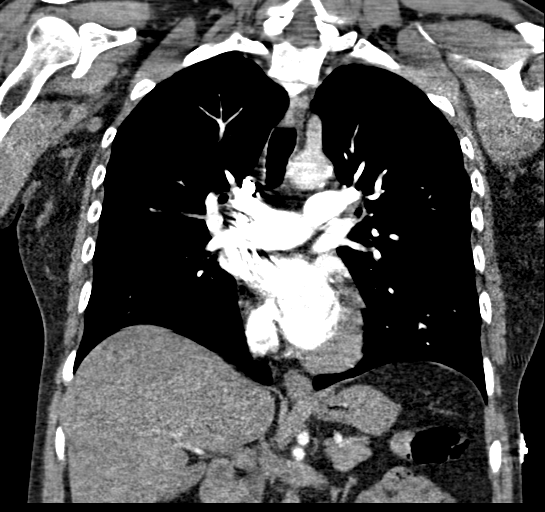

[18 of 36 positions shown; findings below may reference images not displayed]

FINDINGS: Cardiovascular: The study is high quality for the evaluation of
pulmonary embolism. There are no filling defects in the central,
lobar, segmental or subsegmental pulmonary artery branches to
suggest acute pulmonary embolism. Atherosclerotic nonaneurysmal
thoracic aorta. Normal caliber pulmonary arteries. Normal heart
size. No significant pericardial fluid/thickening. Left anterior
descending and right coronary atherosclerosis.

Mediastinum/Nodes: No discrete thyroid nodules. Unremarkable
esophagus. No pathologically enlarged axillary, mediastinal or hilar
lymph nodes.

Lungs/Pleura: No pneumothorax. No pleural effusion. No acute
consolidative airspace disease or lung masses. Right middle lobe
solid 3 mm pulmonary nodule (series 7/image 62). Mild
hypoventilatory changes in the dependent lower lobes. Diffuse
bronchial wall thickening.

Upper abdomen: No acute abnormality.

Musculoskeletal: No aggressive appearing focal osseous lesions. Mild
thoracic spondylosis.

Review of the MIP images confirms the above findings.
IMPRESSION: 1. No pulmonary embolism.
2. Three-vessel coronary atherosclerosis.
3. Right middle lobe 3 mm pulmonary nodule. No follow-up needed if
patient is low-risk. Non-contrast chest CT can be considered in 12
months if patient is high-risk. This recommendation follows the
consensus statement: Guidelines for Management of Incidental
Pulmonary Nodules Detected on CT Images:From the [HOSPITAL]
0160; published online before print (10.1148/radiol.[id number]).

Aortic Atherosclerosis ([id number]).

## 2023-06-30 ENCOUNTER — Other Ambulatory Visit: Payer: Self-pay

## 2023-06-30 ENCOUNTER — Observation Stay (HOSPITAL_COMMUNITY)
Admission: EM | Admit: 2023-06-30 | Discharge: 2023-07-01 | Disposition: A | Discharge status: Home / Self Care | Payer: 59 | Attending: Internal Medicine | Admitting: Internal Medicine

## 2023-06-30 ENCOUNTER — Emergency Department (HOSPITAL_COMMUNITY): Payer: 59

## 2023-06-30 ENCOUNTER — Encounter (HOSPITAL_COMMUNITY): Payer: Self-pay | Admitting: Emergency Medicine

## 2023-06-30 DIAGNOSIS — Z720 Tobacco use: Secondary | ICD-10-CM | POA: Diagnosis present

## 2023-06-30 DIAGNOSIS — F17210 Nicotine dependence, cigarettes, uncomplicated: Secondary | ICD-10-CM | POA: Diagnosis not present

## 2023-06-30 DIAGNOSIS — Z955 Presence of coronary angioplasty implant and graft: Secondary | ICD-10-CM | POA: Diagnosis not present

## 2023-06-30 DIAGNOSIS — H53131 Sudden visual loss, right eye: Secondary | ICD-10-CM | POA: Diagnosis not present

## 2023-06-30 DIAGNOSIS — I10 Essential (primary) hypertension: Secondary | ICD-10-CM | POA: Insufficient documentation

## 2023-06-30 DIAGNOSIS — I2510 Atherosclerotic heart disease of native coronary artery without angina pectoris: Secondary | ICD-10-CM | POA: Insufficient documentation

## 2023-06-30 DIAGNOSIS — H5461 Unqualified visual loss, right eye, normal vision left eye: Secondary | ICD-10-CM | POA: Diagnosis present

## 2023-06-30 DIAGNOSIS — F1010 Alcohol abuse, uncomplicated: Secondary | ICD-10-CM | POA: Diagnosis present

## 2023-06-30 DIAGNOSIS — Z7982 Long term (current) use of aspirin: Secondary | ICD-10-CM | POA: Diagnosis not present

## 2023-06-30 DIAGNOSIS — Z79899 Other long term (current) drug therapy: Secondary | ICD-10-CM | POA: Diagnosis not present

## 2023-06-30 LAB — BASIC METABOLIC PANEL
Anion gap: 4 — ABNORMAL LOW (ref 5–15)
BUN: 10 mg/dL (ref 6–20)
CO2: 25 mmol/L (ref 22–32)
Calcium: 8.8 mg/dL — ABNORMAL LOW (ref 8.9–10.3)
Chloride: 108 mmol/L (ref 98–111)
Creatinine, Ser: 0.92 mg/dL (ref 0.61–1.24)
GFR, Estimated: >60 mL/min (ref >60)
Glucose, Bld: 107 mg/dL — ABNORMAL HIGH (ref 70–99)
Potassium: 4.4 mmol/L (ref 3.5–5.1)
Sodium: 137 mmol/L (ref 135–145)

## 2023-06-30 LAB — LIPID PANEL
Cholesterol: 209 mg/dL — ABNORMAL HIGH (ref 0–200)
HDL: 46 mg/dL (ref >40)
LDL Cholesterol: 117 mg/dL — ABNORMAL HIGH (ref 0–99)
Total CHOL/HDL Ratio: 4.5 RATIO
Triglycerides: 232 mg/dL — ABNORMAL HIGH (ref <150)
VLDL: 46 mg/dL — ABNORMAL HIGH (ref 0–40)

## 2023-06-30 LAB — CBC
HCT: 44.9 % (ref 39.0–52.0)
Hemoglobin: 15.1 g/dL (ref 13.0–17.0)
MCH: 32.8 pg (ref 26.0–34.0)
MCHC: 33.6 g/dL (ref 30.0–36.0)
MCV: 97.4 fL (ref 80.0–100.0)
Platelets: 222 10*3/uL (ref 150–400)
RBC: 4.61 MIL/uL (ref 4.22–5.81)
RDW: 12.3 % (ref 11.5–15.5)
WBC: 4.9 10*3/uL (ref 4.0–10.5)
nRBC: 0 % (ref 0.0–0.2)

## 2023-06-30 LAB — TSH: TSH: 1.861 u[IU]/mL (ref 0.350–4.500)

## 2023-06-30 LAB — APTT: aPTT: 32 seconds (ref 24–36)

## 2023-06-30 LAB — PROTIME-INR
INR: 1 (ref 0.8–1.2)
Prothrombin Time: 13 seconds (ref 11.4–15.2)

## 2023-06-30 LAB — HIV ANTIBODY (ROUTINE TESTING W REFLEX): HIV Screen 4th Generation wRfx: NONREACTIVE

## 2023-06-30 LAB — HEMOGLOBIN A1C
Hgb A1c MFr Bld: 5.3 % (ref 4.8–5.6)
Mean Plasma Glucose: 105.41 mg/dL

## 2023-06-30 MED ORDER — SENNOSIDES-DOCUSATE SODIUM 8.6-50 MG PO TABS: 1.0000 | ORAL_TABLET | Freq: Every evening | ORAL | Status: DC | PRN

## 2023-06-30 MED ORDER — GADOBUTROL 1 MMOL/ML IV SOLN
7.0000 mL | Freq: Once | INTRAVENOUS | Status: AC | PRN
  Administered 2023-06-30: 7 mL via INTRAVENOUS

## 2023-06-30 MED ORDER — ENOXAPARIN SODIUM 40 MG/0.4ML IJ SOSY
40.0000 mg | PREFILLED_SYRINGE | INTRAMUSCULAR | Status: DC
  Administered 2023-06-30: 40 mg via SUBCUTANEOUS
  Filled 2023-06-30: qty 0.4

## 2023-06-30 MED ORDER — IOHEXOL 350 MG/ML SOLN
75.0000 mL | Freq: Once | INTRAVENOUS | Status: AC | PRN
  Administered 2023-06-30: 75 mL via INTRAVENOUS

## 2023-06-30 MED ORDER — THIAMINE HCL 100 MG/ML IJ SOLN: 100.0000 mg | Freq: Every day | INTRAMUSCULAR | Status: DC

## 2023-06-30 MED ORDER — ACETAMINOPHEN 650 MG RE SUPP: 650.0000 mg | Freq: Four times a day (QID) | RECTAL | Status: DC | PRN

## 2023-06-30 MED ORDER — FOLIC ACID 1 MG PO TABS
1.0000 mg | ORAL_TABLET | Freq: Every day | ORAL | Status: DC
  Administered 2023-06-30 – 2023-07-01 (×2): 1 mg via ORAL
  Filled 2023-06-30 (×2): qty 1

## 2023-06-30 MED ORDER — SODIUM CHLORIDE 0.9% FLUSH
3.0000 mL | Freq: Two times a day (BID) | INTRAVENOUS | Status: DC
  Administered 2023-06-30 – 2023-07-01 (×2): 3 mL via INTRAVENOUS

## 2023-06-30 MED ORDER — ACETAMINOPHEN 325 MG PO TABS: 650.0000 mg | ORAL_TABLET | Freq: Four times a day (QID) | ORAL | Status: DC | PRN

## 2023-06-30 MED ORDER — ATORVASTATIN CALCIUM 80 MG PO TABS
80.0000 mg | ORAL_TABLET | Freq: Every day | ORAL | Status: DC
  Administered 2023-06-30: 80 mg via ORAL
  Filled 2023-06-30: qty 1

## 2023-06-30 MED ORDER — TETRACAINE HCL 0.5 % OP SOLN
2.0000 [drp] | Freq: Once | OPHTHALMIC | Status: AC
  Administered 2023-06-30: 2 [drp] via OPHTHALMIC
  Filled 2023-06-30: qty 4

## 2023-06-30 MED ORDER — IPRATROPIUM-ALBUTEROL 0.5-2.5 (3) MG/3ML IN SOLN: 3.0000 mL | RESPIRATORY_TRACT | Status: DC | PRN

## 2023-06-30 MED ORDER — NITROGLYCERIN 0.4 MG SL SUBL: 0.4000 mg | SUBLINGUAL_TABLET | SUBLINGUAL | Status: DC | PRN

## 2023-06-30 MED ORDER — ASPIRIN 81 MG PO TBEC
81.0000 mg | DELAYED_RELEASE_TABLET | Freq: Every day | ORAL | Status: DC
  Administered 2023-06-30 – 2023-07-01 (×2): 81 mg via ORAL
  Filled 2023-06-30 (×2): qty 1

## 2023-06-30 MED ORDER — STROKE: EARLY STAGES OF RECOVERY BOOK
Freq: Once | Status: AC
  Filled 2023-06-30: qty 1

## 2023-06-30 MED ORDER — CLOPIDOGREL BISULFATE 75 MG PO TABS
300.0000 mg | ORAL_TABLET | Freq: Once | ORAL | Status: AC
  Administered 2023-06-30: 300 mg via ORAL
  Filled 2023-06-30: qty 4

## 2023-06-30 MED ORDER — ADULT MULTIVITAMIN W/MINERALS CH
1.0000 | ORAL_TABLET | Freq: Every day | ORAL | Status: DC
  Administered 2023-06-30 – 2023-07-01 (×2): 1 via ORAL
  Filled 2023-06-30 (×2): qty 1

## 2023-06-30 MED ORDER — CLOPIDOGREL BISULFATE 75 MG PO TABS
75.0000 mg | ORAL_TABLET | Freq: Every day | ORAL | Status: DC
  Administered 2023-07-01: 75 mg via ORAL
  Filled 2023-06-30: qty 1

## 2023-06-30 MED ORDER — THIAMINE MONONITRATE 100 MG PO TABS
100.0000 mg | ORAL_TABLET | Freq: Every day | ORAL | Status: DC
  Administered 2023-06-30 – 2023-07-01 (×2): 100 mg via ORAL
  Filled 2023-06-30 (×2): qty 1

## 2023-06-30 NOTE — Care Management (Signed)
Transition of Care [facility]) - Emergency Department Mini Assessment   Patient Details  Name: Blake Hughes MRN: [id number] Date of Birth: 04-Jan-1966  [facility] of [facility] CM/SW Contact:    Lavenia Atlas, RN Phone Number: 06/30/2023, 2:29 PM   Clinical Narrative: Jayme Cloud consult for PCP needs and medication assistance. Per chart review patient is being admitted for stroke work up. TOC will continue to follow.   ED Mini Assessment: What brought you to the Emergency Department? : Patietn reports loss of vision in right eye  Barriers to Discharge: Continued Medical Work up        Interventions which prevented an admission or readmission:  (none)    Patient Contact and Communications        ,                 Admission diagnosis:  lost vision out r eye Patient Active Problem List   Diagnosis Date Noted   Tobacco abuse 07/03/2018   Alcohol abuse 07/03/2018   PCP:  Patient, No Pcp Per Pharmacy:   [facility] - [place], [place] - [address] [place] [address] Phone: [phone] Fax: [phone]

## 2023-06-30 NOTE — ED Notes (Addendum)
Patient transported to MRI 

## 2023-06-30 NOTE — Plan of Care (Signed)
  Problem: Education: Goal: Knowledge of disease or condition will improve Outcome: Progressing   Problem: Coping: Goal: Will verbalize positive feelings about self Outcome: Progressing Goal: Will identify appropriate support needs Outcome: Progressing   Problem: Health Behavior/Discharge Planning: Goal: Ability to manage health-related needs will improve Outcome: Progressing

## 2023-06-30 NOTE — Consult Note (Addendum)
Neurology Consultation  Reason for Consult: Code Stroke Referring Physician: Ray  CC: vision loss  History is obtained from: patient  HPI: OSHEN WLODARCZYK is a 57 y.o. male with a past medical history of CAD s/p stents, smoking presenting with acute vision loss in right eye. He states his vision in his right eye went from grey to black and he has been unable to see out of it since then. He does not take any medications. He states he has not taken his brilinta, aspirin, metoprolol, or Lipitor in a long time. He did have stents placed in 2013 and was taking plavix at one point as well. He does not have a primary care provider. Does have problems affording medications. TOC consulted for PCP and medication assistance.    LKW: 7/23 at 1130 TNK given?: No outside of window Premorbid modified Rankin scale (mRS):  0-Completely asymptomatic and back to baseline post-stroke  ROS: Full ROS was performed and is negative except as noted in the HPI.   Past Medical History:  Diagnosis Date   Coronary artery disease      Family History  Problem Relation Age of Onset   CAD Mother    Diabetes Mellitus II Father    Diabetes Mellitus II Sister     Social History:   reports that he has been smoking cigarettes. He has a 30 pack-year smoking history. He has never used smokeless tobacco. He reports current alcohol use. He reports that he does not use drugs.  Medications No current facility-administered medications for this encounter.  Current Outpatient Medications:    aspirin EC 81 MG EC tablet, Take 1 tablet (81 mg total) by mouth daily. (Patient not taking: Reported on 07/03/2018), Disp: 30 tablet, Rfl: 3   atorvastatin (LIPITOR) 80 MG tablet, Take 1 tablet (80 mg total) by mouth daily at 6 PM., Disp: 30 tablet, Rfl: 0   metoprolol tartrate (LOPRESSOR) 25 MG tablet, Take 1 tablet (25 mg total) by mouth 2 (two) times daily., Disp: 60 tablet, Rfl: 0   nitroGLYCERIN (NITROSTAT) 0.4 MG SL tablet,  Place 1 tablet (0.4 mg total) under the tongue every 5 (five) minutes x 3 doses as needed for chest pain., Disp: 25 tablet, Rfl: 0   ticagrelor (BRILINTA) 90 MG TABS tablet, Take 1 tablet (90 mg total) by mouth 2 (two) times daily., Disp: 60 tablet, Rfl: 0   Exam: Current vital signs: BP 129/76   Pulse 71   Temp 98.8 F (37.1 C) (Oral)   Resp 15   Ht 5\' 9"  (1.753 m)   Wt 69.9 kg   SpO2 97%   BMI 22.74 kg/m  Vital signs in last 24 hours: Temp:  [98.2 F (36.8 C)-98.8 F (37.1 C)] 98.8 F (37.1 C) (07/24 1216) Pulse Rate:  [65-89] 71 (07/24 1145) Resp:  [14-26] 15 (07/24 1145) BP: (129-182)/(76-114) 129/76 (07/24 1030) SpO2:  [96 %-100 %] 97 % (07/24 1145) Weight:  [69.9 kg] 69.9 kg (07/24 0807)  GENERAL: Awake, alert in NAD HEENT: - Normocephalic and atraumatic, dry mm, no LN++, no Thyromegally LUNGS - Clear to auscultation bilaterally with no wheezes CV - S1S2 RRR, no m/r/g, equal pulses bilaterally. ABDOMEN - Soft, nontender, nondistended with normoactive BS Ext: warm, well perfused, intact peripheral pulses, no edema  NEURO:  Mental Status: AA&Ox3  Language: speech is clear.  Naming, repetition, fluency, and comprehension intact. Cranial Nerves: PERRL 4 mm/brisk. EOMI,  visual fields full - right lateral able to see motion- fingers wiggling,  bright light centrally   no facial asymmetry, facial sensation intact, hearing intact, tongue/uvula/soft palate midline, normal sternocleidomastoid and trapezius muscle strength. No evidence of tongue atrophy or fasciculations Motor: Elevates all extremities antigravity Tone: is normal and bulk is normal Sensation- Intact to light touch bilaterally Coordination: FTN intact bilaterally, no ataxia in BLE. Gait- unsteady  NIHSS 1a Level of Conscious.: 0 1b LOC Questions: 0 1c LOC Commands: 0 2 Best Gaze: 0 3 Visual: 2 4 Facial Palsy: 0 5a Motor Arm - left: 0 5b Motor Arm - Right: 0 6a Motor Leg - Left: 0 6b Motor Leg -  Right: 0 7 Limb Ataxia: 0 8 Sensory: 0 9 Best Language: 0 10 Dysarthria: 0 11 Extinct. and Inatten.: 0 TOTAL: 2   Labs I have reviewed labs in epic and the results pertinent to this consultation are:  CBC    Component Value Date/Time   WBC 4.9 06/30/2023 0926   RBC 4.61 06/30/2023 0926   HGB 15.1 06/30/2023 0926   HCT 44.9 06/30/2023 0926   PLT 222 06/30/2023 0926   MCV 97.4 06/30/2023 0926   MCH 32.8 06/30/2023 0926   MCHC 33.6 06/30/2023 0926   RDW 12.3 06/30/2023 0926    CMP     Component Value Date/Time   NA 137 06/30/2023 0926   K 4.4 06/30/2023 0926   CL 108 06/30/2023 0926   CO2 25 06/30/2023 0926   GLUCOSE 107 (H) 06/30/2023 0926   BUN 10 06/30/2023 0926   CREATININE 0.92 06/30/2023 0926   CALCIUM 8.8 (L) 06/30/2023 0926   PROT 6.9 07/03/2018 0621   ALBUMIN 4.0 07/03/2018 0621   AST 25 07/03/2018 0621   ALT 26 07/03/2018 0621   ALKPHOS 61 07/03/2018 0621   BILITOT 0.6 07/03/2018 0621   GFRNONAA >60 06/30/2023 0926   GFRAA >60 07/03/2018 0621    Lipid Panel     Component Value Date/Time   CHOL 195 10/12/2012 0245   TRIG 282 (H) 10/12/2012 0245   HDL 41 10/12/2012 0245   CHOLHDL 4.8 10/12/2012 0245   VLDL 56 (H) 10/12/2012 0245   LDLCALC 98 10/12/2012 0245     Imaging I have reviewed the images obtained:  CT-head/ CT Angio Head and Neck 1. No acute intracranial pathology. 2. 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 3. Calcified plaque at the carotid bifurcations without significant stenosis. 4. Moderate stenosis of the right vertebral artery origin, and severe stenosis in the right V4 segment. 5. 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  MRI examination of the brain  Assessment:  57 y.o. male presenting with acute vision loss in the right eye. Given stroke risk factors, suspect CRAO.   Impression: R CRAO  Recommendations: - Recommend smoking cessation  - HgbA1c, fasting lipid panel  - MRI of the brain  with  without contrast - Echocardiogram - Prophylactic therapy-Antiplatelets   ASA 81mg  and Plavix 75mg  daily  - Risk factor modification - Telemetry monitoring - PT consult, OT consult, Speech consult - Stroke team to follow     Patient seen and examined by NP/APP with MD. MD to update note as needed.   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  Exam unremarkable except for right eye visual loss and right APD.  MRI completed shows no stroke however there is white matter lesions due to his vascular disease.  Loaded with Plavix and start aspirin 81 mg.  Permissive hypertension.  Complete stroke workup.  Counseled him as well as his son.  All questions answered.  Dr. Viviann Spare evaluated pt independently, reviewed imaging, chart, labs. Discussed and formulated plan with the Resident/APP. Changes were made to the note where appropriate. Please see APP/resident note above for details.      MDM: High. Pertinent labs, imaging results reviewed by me and considered in my decision making. Independently reviewed imaging. Stat scans ordered and reviewed. Medical records reviewed. Discussed the patient with another medical provider/personnel. Obtained history from someone other than the patient, his son.    Shavonna Corella,MD

## 2023-06-30 NOTE — ED Notes (Signed)
ED TO INPATIENT HANDOFF REPORT  ED Nurse Name and Phone #: Lenell Antu Name/Age/Gender Blake Hughes 57 y.o. male Room/Bed: 026C/026C  Code Status   Code Status: DNR  Home/SNF/Other Home Patient oriented to: self, place, time, and situation Is this baseline? Yes   Triage Complete: Triage complete  Chief Complaint Acute loss of vision, right [H53.131]  Triage Note Pt presents POV for loss of vision in right eye. Pt states yesterday at 1130am he was on lunch break and was eating a slushie after passed vision in right eye went grey and then black. Pt states he has been unable to see out of right eye since yesterday. Vision has not returned. Pt denies getting anything in eye and states it feels like sinus pressure around eye. Pt wears glasses and was wearing them yesterday when this happened.   Allergies Allergies  Allergen Reactions   Codeine Nausea And Vomiting    Level of Care/Admitting Diagnosis ED Disposition     ED Disposition  Admit   Condition  --   Comment  Hospital Area: [facility] [[id number]]  Level of Care: Telemetry Medical [104]  May place patient in observation at [facility] or [facility] if equivalent level of care is available:: No  Covid Evaluation: Asymptomatic - no recent exposure (last 10 days) testing not required  Diagnosis: Acute loss of vision, right [[id number]]  Admitting Physician: Reymundo Poll [[id number]]  Attending Physician: Reymundo Poll [[id number]]          B Medical/Surgery History Past Medical History:  Diagnosis Date   Coronary artery disease    Past Surgical History:  Procedure Laterality Date   CARDIAC CATHETERIZATION     CORONARY ANGIOPLASTY     LEFT HEART CATHETERIZATION WITH CORONARY ANGIOGRAM N/A 10/11/2012   Procedure: LEFT HEART CATHETERIZATION WITH CORONARY ANGIOGRAM;  Surgeon: Robynn Pane, MD;  Location: [facility] CATH LAB;  Service: Cardiovascular;  Laterality: N/A;   right arm surgery        A IV Location/Drains/Wounds Patient Lines/Drains/Airways Status     Active Line/Drains/Airways     Name Placement date Placement time Site Days   Peripheral IV 06/30/23 20 G Left Antecubital 06/30/23  0924  Antecubital  less than 1            Intake/Output Last 24 hours No intake or output data in the 24 hours ending 06/30/23 1639  Labs/Imaging Results for orders placed or performed during the hospital encounter of 06/30/23 (from the past 48 hour(s))  CBC     Status: None   Collection Time: 06/30/23  9:26 AM  Result Value Ref Range   WBC 4.9 4.0 - 10.5 K/uL   RBC 4.61 4.22 - 5.81 MIL/uL   Hemoglobin 15.1 13.0 - 17.0 g/dL   HCT 29.5 28.4 - 13.2 %   MCV 97.4 80.0 - 100.0 fL   MCH 32.8 26.0 - 34.0 pg   MCHC 33.6 30.0 - 36.0 g/dL   RDW 44.0 10.2 - 72.5 %   Platelets 222 150 - 400 K/uL   nRBC 0.0 0.0 - 0.2 %    Comment: Performed at [facility], [address], [place], [place] [address]  Basic metabolic panel     Status: Abnormal   Collection Time: 06/30/23  9:26 AM  Result Value Ref Range   Sodium 137 135 - 145 mmol/L   Potassium 4.4 3.5 - 5.1 mmol/L   Chloride 108 98 - 111 mmol/L  CO2 25 22 - 32 mmol/L   Glucose, Bld 107 (H) 70 - 99 mg/dL    Comment: Glucose reference range applies only to samples taken after fasting for at least 8 hours.   BUN 10 6 - 20 mg/dL   Creatinine, Ser 0.27 0.61 - 1.24 mg/dL   Calcium 8.8 (L) 8.9 - 10.3 mg/dL   GFR, Estimated >25 >36 mL/min    Comment: (NOTE) Calculated using the CKD-EPI Creatinine Equation (2021)    Anion gap 4 (L) 5 - 15    Comment: Performed at [facility], [address], [place], [place] [address]  Protime-INR     Status: None   Collection Time: 06/30/23  9:26 AM  Result Value Ref Range   Prothrombin Time 13.0 11.4 - 15.2 seconds   INR 1.0 0.8 - 1.2    Comment: (NOTE) INR goal varies based on device and disease states. Performed at [facility], [address] [place],  [place] [address]   APTT     Status: None   Collection Time: 06/30/23  9:26 AM  Result Value Ref Range   aPTT 32 24 - 36 seconds    Comment: Performed at [facility], [address] [place], [place] [address]   MR Brain W and Wo Contrast  Result Date: 06/30/2023 EXAM: MRI HEAD WITHOUT AND WITH CONTRAST TECHNIQUE: Multiplanar, multiecho pulse sequences of the brain and surrounding structures were obtained without and with intravenous contrast. CONTRAST:  7mL GADAVIST GADOBUTROL 1 MMOL/ML IV SOLN COMPARISON:  CT studies same day. FINDINGS: Brain: Diffusion imaging does not show any acute or subacute infarction. Mild chronic small-vessel ischemic changes present within the pons. No focal cerebellar insult. Cerebral hemispheres show mild chronic small-vessel ischemic change of the thalami and basal ganglia with an old lacunar infarction in the left caudate head. Minimal small vessel change of the cerebral hemispheric white matter. No cortical or large vessel territory infarction. No mass lesion, hemorrhage, hydrocephalus or extra-axial collection. After contrast administration, no abnormal enhancement occurs. Vascular: Major vessels at the base of the brain show flow. Skull and upper cervical spine: Negative Sinuses/Orbits: Clear/normal Other: None IMPRESSION: No acute or reversible finding. Chronic small-vessel ischemic changes affecting the pons, thalami, basal ganglia and left caudate head. Electronically Signed   By: Paulina Fusi M.D.   On: 06/30/2023 15:07   CT ANGIO HEAD NECK W WO CM  Result Date: 06/30/2023 CLINICAL DATA:  Vision loss EXAM: CT ANGIOGRAPHY HEAD AND NECK WITH AND WITHOUT CONTRAST TECHNIQUE: Multidetector CT imaging of the head and neck was performed using the standard protocol during bolus administration of intravenous contrast. Multiplanar CT image reconstructions and MIPs were obtained to evaluate the vascular anatomy. Carotid stenosis measurements (when applicable) are obtained  utilizing NASCET criteria, using the distal internal carotid diameter as the denominator. RADIATION DOSE REDUCTION: This exam was performed according to the departmental dose-optimization program which includes automated exposure control, adjustment of the mA and/or kV according to patient size and/or use of iterative reconstruction technique. CONTRAST:  75mL OMNIPAQUE IOHEXOL 350 MG/ML SOLN COMPARISON:  None Available. FINDINGS: CT HEAD FINDINGS Brain: There is no acute intracranial hemorrhage, extra-axial fluid collection, or acute infarct Parenchymal volume is normal. The ventricles are normal in size. Gray-white differentiation is preserved. There are small remote infarcts in the left caudate and lentiform nuclei. No cortical encephalomalacia is seen. The pituitary and suprasellar region are normal. There is no mass lesion. There is no mass effect  or midline shift. Vascular: Low. Skull: Normal. Negative for fracture or focal lesion. Sinuses/Orbits: The paranasal sinuses are clear. The globes and orbits are unremarkable. Other: The mastoid air cells and middle ear cavities are clear. Review of the MIP images confirms the above findings CTA NECK FINDINGS Aortic arch: There is a minimal calcified plaque in the imaged aortic arch. The origins of the major branch vessels are patent. The subclavian arteries are patent to the level imaged. Right carotid system: The right common, internal, and external carotid arteries are patent, with mild plaque at the bifurcation and in the proximal internal carotid artery resulting in less than 50% stenosis. There is no evidence of dissection or aneurysm. Left carotid system: The left common, internal, and external carotid arteries are patent, with mild plaque in the common carotid artery, at the bifurcation, and in the proximal internal carotid artery resulting in less than 50% stenosis. There is no evidence of dissection or aneurysm. Vertebral arteries: There is moderate stenosis  of the right vertebral artery at its origin and mild plaque in the V2 segment. There is mild plaque in the proximal left V1 segment which arises directly from the aortic arch. There is no evidence of dissection or aneurysm. Skeleton: There is no acute osseous abnormality or suspicious osseous lesion. There is multilevel degenerative endplate change in the cervical spine. There is no visible canal hematoma. Other neck: The soft tissues of the neck are unremarkable. Upper chest: The imaged lung apices are clear. Review of the MIP images confirms the above findings CTA HEAD FINDINGS Anterior circulation: There is calcified plaque in the intracranial ICAs resulting in moderate stenosis of the right cavernous segment and no significant stenosis on the left. The bilateral MCAs are patent, without proximal stenosis or occlusion. The right A1 segment is hypoplastic/absent, a developmental variant. Otherwise, the ACAS are patent, without proximal stenosis or occlusion. The anterior communicating artery is normal There is no aneurysm or AVM. Posterior circulation: There is calcified plaque in the right V4 segment with up to severe stenosis just proximal to the plaque ([phone]). The V4 segment is patent to the vertebrobasilar junction. The left V4 segment is patent, without significant stenosis or occlusion. The basilar artery is patent. The major cerebellar arteries appear patent. The bilateral PCAs are patent, without proximal stenosis or occlusion. Posterior communicating arteries are not identified. There is no aneurysm or AVM. Venous sinuses: Patent. Anatomic variants: As above. Review of the MIP images confirms the above findings IMPRESSION: 1. No acute intracranial pathology. 2. 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 3. Calcified plaque at the carotid bifurcations without significant stenosis. 4. Moderate stenosis of the right vertebral artery origin, and severe stenosis in the right V4 segment.  5. 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 Electronically Signed   By: Lesia Hausen M.D.   On: 06/30/2023 11:55    Pending Labs Unresulted Labs (From admission, onward)     Start     Ordered   07/01/23 0500  CBC  Tomorrow morning,   R        06/30/23 1514   07/01/23 0500  Comprehensive metabolic panel  Tomorrow morning,   R        06/30/23 1514   06/30/23 1510  TSH  Once,   R        06/30/23 1514   06/30/23 1508  HIV Antibody (routine testing w rflx)  (HIV Antibody (Routine testing w reflex) panel)  Once,  R        06/30/23 1514   06/30/23 1320  Urine rapid drug screen (hosp performed)not at [facility])  Once,   STAT        06/30/23 1320   06/30/23 1240  Hemoglobin A1c  Once,   URGENT        06/30/23 1239   06/30/23 1240  Lipid panel  Once,   URGENT        06/30/23 1239            Vitals/Pain Today's Vitals   06/30/23 1130 06/30/23 1145 06/30/23 1216 06/30/23 1230  BP:    (!) 157/83  Pulse: 65 71  74  Resp: 16 15  17   Temp:   98.8 F (37.1 C)   TempSrc:   Oral   SpO2: 97% 97%  100%  Weight:      Height:      PainSc:        Isolation Precautions No active isolations  Medications Medications   stroke: early stages of recovery book (has no administration in time range)  atorvastatin (LIPITOR) tablet 80 mg (has no administration in time range)  aspirin EC tablet 81 mg (has no administration in time range)  enoxaparin (LOVENOX) injection 40 mg (has no administration in time range)  sodium chloride flush (NS) 0.9 % injection 3 mL (has no administration in time range)  acetaminophen (TYLENOL) tablet 650 mg (has no administration in time range)    Or  acetaminophen (TYLENOL) suppository 650 mg (has no administration in time range)  senna-docusate (Senokot-S) tablet 1 tablet (has no administration in time range)  thiamine (VITAMIN B1) tablet 100 mg (has no administration in time range)    Or  thiamine (VITAMIN B1) injection 100 mg (has no administration in  time range)  folic acid (FOLVITE) tablet 1 mg (has no administration in time range)  multivitamin with minerals tablet 1 tablet (has no administration in time range)  ipratropium-albuterol (DUONEB) 0.5-2.5 (3) MG/3ML nebulizer solution 3 mL (has no administration in time range)  nitroGLYCERIN (NITROSTAT) SL tablet 0.4 mg (has no administration in time range)  clopidogrel (PLAVIX) tablet 300 mg (has no administration in time range)  clopidogrel (PLAVIX) tablet 75 mg (has no administration in time range)  tetracaine (PONTOCAINE) 0.5 % ophthalmic solution 2 drop (2 drops Right Eye Given 06/30/23 0903)  iohexol (OMNIPAQUE) 350 MG/ML injection 75 mL (75 mLs Intravenous Contrast Given 06/30/23 1048)  gadobutrol (GADAVIST) 1 MMOL/ML injection 7 mL (7 mLs Intravenous Contrast Given 06/30/23 1431)    Mobility walks     Focused Assessments     R Recommendations: See Admitting Provider Note  Report given to:   Additional Notes:

## 2023-06-30 NOTE — ED Triage Notes (Signed)
Pt presents POV for loss of vision in right eye. Pt states yesterday at 1130am he was on lunch break and was eating a slushie after passed vision in right eye went grey and then black. Pt states he has been unable to see out of right eye since yesterday. Vision has not returned. Pt denies getting anything in eye and states it feels like sinus pressure around eye. Pt wears glasses and was wearing them yesterday when this happened.

## 2023-06-30 NOTE — Evaluation (Signed)
Physical Therapy Evaluation Patient Details Name: Blake Hughes MRN: [id number] DOB: [birth year] Today's Date: 06/30/2023  History of Present Illness  57yo male who lost vision in his R eye suddenly on 06/29/23. Went to sleep hoping vision would return the next day, it did not so he presented to the ED today. Brain MRI negative for CVA, CTA negative for acute changes. PMH CAD s/p stent, HTN, HLD, tobacco use  Clinical Impression    Patient received in bed, pleasant and cooperative today but understandably anxious about loss of vision R eye. Able to mobilize on an independent basis with some very mild mobility deficits due to new onset loss of depth perception. Education provided regarding compensations for vision loss including turning head to scan environment and not moving quickly. Left in ED stretcher with all needs met, family present. No skilled PT needs at this time, appropriate for medical workup only as ambulation/balance/mobility appears to be at baseline. Signing off thank you for the referral!         Assistance Recommended at Discharge None  If plan is discharge home, recommend the following:  Can travel by private vehicle  Other (comment) (no barriers)        Equipment Recommendations None recommended by PT  Recommendations for Other Services       Functional Status Assessment Patient has not had a recent decline in their functional status     Precautions / Restrictions Precautions Precautions: Other (comment) Precaution Comments: poor vision R eye Restrictions Weight Bearing Restrictions: No      Mobility  Bed Mobility Overal bed mobility: Independent                  Transfers Overall transfer level: Independent Equipment used: None                    Ambulation/Gait Ambulation/Gait assistance: Independent Gait Distance (Feet): 300 Feet Assistive device: None Gait Pattern/deviations: WFL(Within Functional Limits)           Stairs            Wheelchair Mobility     Tilt Bed    Modified Rankin (Stroke Patients Only)       Balance Overall balance assessment: Independent                                           Pertinent Vitals/Pain Pain Assessment Pain Assessment: No/denies pain    Home Living Family/patient expects to be discharged to:: Private residence Living Arrangements: Alone   Type of Home: House Home Access: Stairs to enter Entrance Stairs-Rails: None Entrance Stairs-Number of Steps: 4-6 no rails   Home Layout: One level Home Equipment: None Additional Comments: 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, also have a lot of animals at home"    Prior Function Prior Level of Function : Independent/Modified Independent                     Hand Dominance        Extremity/Trunk Assessment   Upper Extremity Assessment Upper Extremity Assessment: Overall WFL for tasks assessed    Lower Extremity Assessment Lower Extremity Assessment: Overall WFL for tasks assessed    Cervical / Trunk Assessment Cervical / Trunk Assessment: Normal  Communication   Communication: No difficulties  Cognition Arousal/Alertness: Awake/alert Behavior During Therapy: WFL for tasks assessed/performed Overall Cognitive  Status: Within Functional Limits for tasks assessed                                          General Comments      Exercises     Assessment/Plan    PT Assessment Patient does not need any further PT services  PT Problem List         PT Treatment Interventions      PT Goals (Current goals can be found in the Care Plan section)  Acute Rehab PT Goals Patient Stated Goal: get vision in R eye back PT Goal Formulation: With patient Time For Goal Achievement: 07/14/23 Potential to Achieve Goals: Good    Frequency       Co-evaluation               AM-PAC PT "6 Clicks" Mobility  Outcome Measure Help needed  turning from your back to your side while in a flat bed without using bedrails?: None Help needed moving from lying on your back to sitting on the side of a flat bed without using bedrails?: None Help needed moving to and from a bed to a chair (including a wheelchair)?: None Help needed standing up from a chair using your arms (e.g., wheelchair or bedside chair)?: None Help needed to walk in hospital room?: None Help needed climbing 3-5 steps with a railing? : None 6 Click Score: 24    End of Session   Activity Tolerance: Patient tolerated treatment well Patient left: in bed;Other (comment) (ED stretcher) Nurse Communication: Mobility status PT Visit Diagnosis: Other symptoms and signs involving the nervous system (U98.119)    Time: 1540-1550 PT Time Calculation (min) (ACUTE ONLY): 10 min   Charges:   PT Evaluation $PT Eval Low Complexity: 1 Low   PT General Charges $$ ACUTE PT VISIT: 1 Visit        Nedra Hai, PT, DPT 06/30/23 3:59 PM
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
## 2023-06-30 NOTE — Hospital Course (Addendum)
  CRAO Patient presented to the ED on 7/24 with 24 hours of painless acute near total vision loss in his right eye. Neurology was consulted and was suspicious of CRAO. Ophthalmology was called and recommended outpatient follow up and no further therapy other than stroke work up. CTA of head and neck showed no acute intracranial pathology, small remote infarcts in the left caudate and lentiform nuclei, calcified plaque at the carotid bifurcations without significant stenosis, moderate stenosis of the right vertebral artery origin, and severe stenosis in the right V4 segment, moderate stenosis of the right cavernous ICA. MRI of brain showed chronic small vessel ischemic changes affecting the pons, thalami, basal ganglia, and left caudate head. Echocardiogram showed no atrial level shunt detected by color flow Doppler. Work up was done for other risk factors. Ha1c was 5.3. Lipid panel showed LDL of 117 and TC of 209. Will begin atorvastatin 80mg .   Hypertension- Patient has a history of high blood pressure for which he claims he has never taken medication. Medication was held during hospitalization for permissive hypertension window of 48 hours. BP 140/94 on discharge: Will send home with losartan 25mg  and recommend outpatient follow up with PCP.  CAD s/p stent 2013 Patient has a history of prior MI and known CAD with a stent placed in the LAD in 2013. Patient has never taken any medication for this. Patient reports that he has frequent chest pain with exertion but has no symptoms here. EKG was clear and showed no acute ischemic changes and an old anteroseptal infarct. Echo was ordered and showed stage LVEF of 60-65% with 1 diastolic dysfunction. Will discharge patient on atorvastatin 80mg , losartan 25mg   and aspirin 81mg .   Tobacco use Patient reports a 40+ pack year history of smoking. Patient was offered nicotine patches for his hospital stay but declined.   Alcohol use Patient reports drinking 8-9  beers per day. Patient had a CAGE score of 3 but refused supportive materials for alcohol cessation. LFT's were wnl. Patient was placed on CIWA w/out ativan but scores remained a 0 throughout his hospitalization. Patient was counseled on alcohol cessation.  Patient received thiamine, folic acid, and multivitamins.

## 2023-06-30 NOTE — ED Provider Notes (Signed)
[place] EMERGENCY DEPARTMENT AT [facility] Provider Note   CSN: [id number] Arrival date & time: 06/30/23  2952     History  Chief Complaint  Patient presents with   Loss of Vision    Blake Hughes is a 57 y.o. male past medical history seen for tobacco abuse, alcohol abuse, coronary artery disease with previous stents, patient reports that he was supposed to be taking blood thinner, his chart lists Brilinta but patient reports he remembers taking Plavix, reportedly has not had any blood thinning medication or antiplatelet therapy for 6 years as it was "hurting his stomach.  Patient reports yesterday at noon he had a bright flash in the right eye followed by total vision loss in the affected eye.  Initially was gray and then became totally black.  Patient reports that she was hoping that were to return if he went to sleep, has not had any return of vision at this time.  No previous episodes of similar.  HPI     Home Medications Prior to Admission medications   Medication Sig Start Date End Date Taking? Authorizing Provider  aspirin EC 81 MG EC tablet Take 1 tablet (81 mg total) by mouth daily. Patient not taking: Reported on 07/03/2018 10/12/12   Rinaldo Cloud, MD  atorvastatin (LIPITOR) 80 MG tablet Take 1 tablet (80 mg total) by mouth daily at 6 PM. 07/04/18   Lonia Blood, MD  metoprolol tartrate (LOPRESSOR) 25 MG tablet Take 1 tablet (25 mg total) by mouth 2 (two) times daily. 07/04/18   Lonia Blood, MD  nitroGLYCERIN (NITROSTAT) 0.4 MG SL tablet Place 1 tablet (0.4 mg total) under the tongue every 5 (five) minutes x 3 doses as needed for chest pain. 07/04/18   Lonia Blood, MD  ticagrelor (BRILINTA) 90 MG TABS tablet Take 1 tablet (90 mg total) by mouth 2 (two) times daily. 07/04/18   Lonia Blood, MD      Allergies    Codeine    Review of Systems   Review of Systems  Eyes:  Positive for visual disturbance.  All other systems reviewed  and are negative.   Physical Exam Updated Vital Signs BP (!) 157/83   Pulse 74   Temp 98.8 F (37.1 C) (Oral)   Resp 17   Ht 5\' 9"  (1.753 m)   Wt 69.9 kg   SpO2 100%   BMI 22.74 kg/m  Physical Exam Vitals and nursing note reviewed.  Constitutional:      General: He is not in acute distress.    Appearance: Normal appearance.  HENT:     Head: Normocephalic and atraumatic.  Eyes:     General:        Right eye: No discharge.        Left eye: No discharge.     Comments: OS 10/12.5, OD with no vision. Right eye with pupillary reaction to light, as well as consensual response to light in left eye. Patient reports red spider-like projections with bright light into right eye.  Bedside ultrasound performed and shows no evidence of retinal detachment on the right  Cardiovascular:     Rate and Rhythm: Normal rate and regular rhythm.     Heart sounds: No murmur heard.    No friction rub. No gallop.  Pulmonary:     Effort: Pulmonary effort is normal.     Breath sounds: Normal breath sounds.  Abdominal:     General: Bowel sounds are normal.  Palpations: Abdomen is soft.  Skin:    General: Skin is warm and dry.     Capillary Refill: Capillary refill takes less than 2 seconds.  Neurological:     Mental Status: He is alert and oriented to person, place, and time.     Comments: Cranial nerves III through XII grossly intact.  Intact finger-nose, intact heel-to-shin.  Romberg negative, gait normal.  Alert and oriented x3.  Moves all 4 limbs spontaneously, normal coordination.  No pronator drift.  Intact strength 5 out of 5 bilateral upper and lower extremities.    Psychiatric:        Mood and Affect: Mood normal.        Behavior: Behavior normal.     ED Results / Procedures / Treatments   Labs (all labs ordered are listed, but only abnormal results are displayed) Labs Reviewed  BASIC METABOLIC PANEL - Abnormal; Notable for the following components:      Result Value    Glucose, Bld 107 (*)    Calcium 8.8 (*)    Anion gap 4 (*)    All other components within normal limits  CBC  PROTIME-INR  APTT  HEMOGLOBIN A1C  LIPID PANEL  RAPID URINE DRUG SCREEN, HOSP PERFORMED    EKG EKG Interpretation Date/Time:  Wednesday June 30 2023 09:06:36 EDT Ventricular Rate:  71 PR Interval:  156 QRS Duration:  77 QT Interval:  376 QTC Calculation: 409 R Axis:   68  Text Interpretation: Sinus rhythm Anteroseptal infarct, old Confirmed by Margarita Grizzle [phone]) on 06/30/2023 9:10:45 AM  Radiology CT ANGIO HEAD NECK W WO CM  Result Date: 06/30/2023 CLINICAL DATA:  Vision loss EXAM: CT ANGIOGRAPHY HEAD AND NECK WITH AND WITHOUT CONTRAST TECHNIQUE: Multidetector CT imaging of the head and neck was performed using the standard protocol during bolus administration of intravenous contrast. Multiplanar CT image reconstructions and MIPs were obtained to evaluate the vascular anatomy. Carotid stenosis measurements (when applicable) are obtained utilizing NASCET criteria, using the distal internal carotid diameter as the denominator. RADIATION DOSE REDUCTION: This exam was performed according to the departmental dose-optimization program which includes automated exposure control, adjustment of the mA and/or kV according to patient size and/or use of iterative reconstruction technique. CONTRAST:  75mL OMNIPAQUE IOHEXOL 350 MG/ML SOLN COMPARISON:  None Available. FINDINGS: CT HEAD FINDINGS Brain: There is no acute intracranial hemorrhage, extra-axial fluid collection, or acute infarct Parenchymal volume is normal. The ventricles are normal in size. Gray-white differentiation is preserved. There are small remote infarcts in the left caudate and lentiform nuclei. No cortical encephalomalacia is seen. The pituitary and suprasellar region are normal. There is no mass lesion. There is no mass effect or midline shift. Vascular: Low. Skull: Normal. Negative for fracture or focal lesion.  Sinuses/Orbits: The paranasal sinuses are clear. The globes and orbits are unremarkable. Other: The mastoid air cells and middle ear cavities are clear. Review of the MIP images confirms the above findings CTA NECK FINDINGS Aortic arch: There is a minimal calcified plaque in the imaged aortic arch. The origins of the major branch vessels are patent. The subclavian arteries are patent to the level imaged. Right carotid system: The right common, internal, and external carotid arteries are patent, with mild plaque at the bifurcation and in the proximal internal carotid artery resulting in less than 50% stenosis. There is no evidence of dissection or aneurysm. Left carotid system: The left common, internal, and external carotid arteries are patent, with mild plaque in the  common carotid artery, at the bifurcation, and in the proximal internal carotid artery resulting in less than 50% stenosis. There is no evidence of dissection or aneurysm. Vertebral arteries: There is moderate stenosis of the right vertebral artery at its origin and mild plaque in the V2 segment. There is mild plaque in the proximal left V1 segment which arises directly from the aortic arch. There is no evidence of dissection or aneurysm. Skeleton: There is no acute osseous abnormality or suspicious osseous lesion. There is multilevel degenerative endplate change in the cervical spine. There is no visible canal hematoma. Other neck: The soft tissues of the neck are unremarkable. Upper chest: The imaged lung apices are clear. Review of the MIP images confirms the above findings CTA HEAD FINDINGS Anterior circulation: There is calcified plaque in the intracranial ICAs resulting in moderate stenosis of the right cavernous segment and no significant stenosis on the left. The bilateral MCAs are patent, without proximal stenosis or occlusion. The right A1 segment is hypoplastic/absent, a developmental variant. Otherwise, the ACAS are patent, without proximal  stenosis or occlusion. The anterior communicating artery is normal There is no aneurysm or AVM. Posterior circulation: There is calcified plaque in the right V4 segment with up to severe stenosis just proximal to the plaque ([phone]). The V4 segment is patent to the vertebrobasilar junction. The left V4 segment is patent, without significant stenosis or occlusion. The basilar artery is patent. The major cerebellar arteries appear patent. The bilateral PCAs are patent, without proximal stenosis or occlusion. Posterior communicating arteries are not identified. There is no aneurysm or AVM. Venous sinuses: Patent. Anatomic variants: As above. Review of the MIP images confirms the above findings IMPRESSION: 1. No acute intracranial pathology. 2. 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 3. Calcified plaque at the carotid bifurcations without significant stenosis. 4. Moderate stenosis of the right vertebral artery origin, and severe stenosis in the right V4 segment. 5. 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 Electronically Signed   By: Lesia Hausen M.D.   On: 06/30/2023 11:55    Procedures Procedures    Medications Ordered in ED Medications   stroke: early stages of recovery book (has no administration in time range)  gadobutrol (GADAVIST) 1 MMOL/ML injection 7 mL (has no administration in time range)  tetracaine (PONTOCAINE) 0.5 % ophthalmic solution 2 drop (2 drops Right Eye Given 06/30/23 0903)  iohexol (OMNIPAQUE) 350 MG/ML injection 75 mL (75 mLs Intravenous Contrast Given 06/30/23 1048)    ED Course/ Medical Decision Making/ A&P Clinical Course as of 06/30/23 1417  Wed Jun 30, 2023  0904 IOP 13mm HG [CP]  0909 OS 10/12.5 OD no vision [CP]  1326 [facility] at discharge [CP]  1342 IMTS Gomez to admit [CP]    Clinical Course User Index [CP] Olene Floss, PA-C                             Medical Decision Making Amount and/or Complexity of Data  Reviewed Labs: ordered. Radiology: ordered.  Risk Prescription drug management.   This patient is a 57 y.o. male who presents to the ED for concern of sudden right sided visual loss, this involves an extensive number of treatment options, and is a complaint that carries with it a high risk of complications and morbidity. The emergent differential diagnosis prior to evaluation includes, but is not limited to,  stroke, ICH, CRAO, central retinal vein occlusion,  retinal detachment, acute angle closure glaucoma, trauma, vs other . This is not an exhaustive differential.   Past Medical History / Co-morbidities / Social History: CAD, previous stents in place, not currently compliant with his antiplatelet therapy  Physical Exam: Physical exam performed. The pertinent findings include: OS 10/12.5, OD with no vision. Right eye with pupillary reaction to light, as well as consensual response to light in left eye. Patient reports red spider-like projections with bright light into right eye.  Bedside ultrasound performed and shows no evidence of retinal detachment on the right   Cranial nerves III through XII grossly intact.  Intact finger-nose, intact heel-to-shin.  Romberg negative, gait normal.  Alert and oriented x3.  Moves all 4 limbs spontaneously, normal coordination.  No pronator drift.  Intact strength 5 out of 5 bilateral upper and lower extremities.  Lab Tests: I ordered, and personally interpreted labs.  The pertinent results include: CBC, BMP, APTT, PT/INR all unremarkable.   Imaging Studies: I ordered imaging studies including CT angio head and neck with without contrast. I independently visualized and interpreted imaging which showed  1. No acute intracranial pathology.  2. 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  3. Calcified plaque at the carotid bifurcations without significant  stenosis.  4. Moderate stenosis of the right vertebral artery origin, and   severe stenosis in the right V4 segment.  5. 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   No findings to explain sudden right sided visual loss . I agree with the radiologist interpretation.   Cardiac Monitoring:  The patient was maintained on a cardiac monitor.  My attending physician Dr. Rosalia Hammers viewed and interpreted the cardiac monitored which showed an underlying rhythm of: NSR. I agree with this interpretation.   Medications: I ordered medication including tetracaine for ocular exam   Consultations Obtained: I requested consultation with the neurologist, spoke with Dr. Jerrell Belfast who reviewed patient's lab work, imaging, and physical exam findings, recommends MRI brain and stroke workup, suspicious for CRAO, spoke with ophthalmologist, Dr. Essie Hart,  and discussed lab and imaging findings as well as pertinent plan - they recommend: No additional workup at this time other than admission for stroke workup, patient will follow-up with [facility] retina on discharge for further evaluation   Disposition: After consideration of the diagnostic results and the patients response to treatment, I feel that patient to be admitted for stroke workup, spoke with internal medicine teaching service Dr. Lily Kocher who will admit.   I discussed this case with my attending physician Dr. Rosalia Hammers who cosigned this note including patient's presenting symptoms, physical exam, and planned diagnostics and interventions. Attending physician stated agreement with plan or made changes to plan which were implemented.    Final Clinical Impression(s) / ED Diagnoses Final diagnoses:  Vision loss of right eye    Rx / DC Orders ED Discharge Orders     None         [place] 06/30/23 1417    Margarita Grizzle, MD 07/01/23 [phone]

## 2023-07-01 ENCOUNTER — Observation Stay (HOSPITAL_BASED_OUTPATIENT_CLINIC_OR_DEPARTMENT_OTHER): Payer: 59

## 2023-07-01 ENCOUNTER — Other Ambulatory Visit (HOSPITAL_COMMUNITY): Payer: Self-pay

## 2023-07-01 DIAGNOSIS — I10 Essential (primary) hypertension: Secondary | ICD-10-CM | POA: Insufficient documentation

## 2023-07-01 DIAGNOSIS — I2510 Atherosclerotic heart disease of native coronary artery without angina pectoris: Secondary | ICD-10-CM | POA: Insufficient documentation

## 2023-07-01 DIAGNOSIS — H3411 Central retinal artery occlusion, right eye: Secondary | ICD-10-CM

## 2023-07-01 DIAGNOSIS — H53131 Sudden visual loss, right eye: Secondary | ICD-10-CM | POA: Diagnosis not present

## 2023-07-01 LAB — COMPREHENSIVE METABOLIC PANEL
ALT: 36 U/L (ref 0–44)
AST: 28 U/L (ref 15–41)
Albumin: 3.4 g/dL — ABNORMAL LOW (ref 3.5–5.0)
Alkaline Phosphatase: 58 U/L (ref 38–126)
Anion gap: 9 (ref 5–15)
BUN: 10 mg/dL (ref 6–20)
CO2: 27 mmol/L (ref 22–32)
Chloride: 96 mmol/L — ABNORMAL LOW (ref 98–111)
Creatinine, Ser: 1 mg/dL (ref 0.61–1.24)
GFR, Estimated: >60 mL/min (ref >60)
Glucose, Bld: 107 mg/dL — ABNORMAL HIGH (ref 70–99)
Potassium: 4.4 mmol/L (ref 3.5–5.1)
Sodium: 132 mmol/L — ABNORMAL LOW (ref 135–145)
Total Bilirubin: 1 mg/dL (ref 0.3–1.2)
Total Protein: 6.3 g/dL — ABNORMAL LOW (ref 6.5–8.1)

## 2023-07-01 LAB — CBC
HCT: 42.1 % (ref 39.0–52.0)
Hemoglobin: 14.4 g/dL (ref 13.0–17.0)
MCH: 32 pg (ref 26.0–34.0)
MCV: 93.6 fL (ref 80.0–100.0)
Platelets: 206 10*3/uL (ref 150–400)
RBC: 4.5 MIL/uL (ref 4.22–5.81)
RDW: 12.1 % (ref 11.5–15.5)
WBC: 6.6 10*3/uL (ref 4.0–10.5)
nRBC: 0 % (ref 0.0–0.2)

## 2023-07-01 LAB — ECHOCARDIOGRAM COMPLETE
Area-P 1/2: 3.37 cm2
Calc EF: 61 %
Height: 69 in
S' Lateral: 3.3 cm
Single Plane A2C EF: 59.9 %

## 2023-07-01 MED ORDER — ADULT MULTIVITAMIN W/MINERALS CH: 1.0000 | ORAL_TABLET | Freq: Every day | ORAL | 0 refills | Status: DC

## 2023-07-01 MED ORDER — NITROGLYCERIN 0.4 MG SL SUBL: 0.4000 mg | SUBLINGUAL_TABLET | SUBLINGUAL | 0 refills | Status: DC | PRN

## 2023-07-01 MED ORDER — NITROGLYCERIN 0.4 MG SL SUBL
0.4000 mg | SUBLINGUAL_TABLET | SUBLINGUAL | 0 refills | Status: DC | PRN
  Filled 2023-07-01: qty 25, 30d supply, fill #0

## 2023-07-01 MED ORDER — CLOPIDOGREL BISULFATE 75 MG PO TABS: 75.0000 mg | ORAL_TABLET | Freq: Every day | ORAL | 0 refills | Status: DC

## 2023-07-01 MED ORDER — LOSARTAN POTASSIUM 25 MG PO TABS: 25.0000 mg | ORAL_TABLET | Freq: Every day | ORAL | 0 refills | Status: DC

## 2023-07-01 MED ORDER — ATORVASTATIN CALCIUM 80 MG PO TABS: 80.0000 mg | ORAL_TABLET | Freq: Every day | ORAL | 0 refills | Status: DC

## 2023-07-01 MED ORDER — FOLIC ACID 1 MG PO TABS
1.0000 mg | ORAL_TABLET | Freq: Every day | ORAL | 0 refills | Status: DC
  Filled 2023-07-01: qty 30, 30d supply, fill #0

## 2023-07-01 MED ORDER — NICOTINE POLACRILEX 2 MG MT GUM
2.0000 mg | CHEWING_GUM | OROMUCOSAL | Status: DC | PRN
  Filled 2023-07-01: qty 1

## 2023-07-01 MED ORDER — CLOPIDOGREL BISULFATE 75 MG PO TABS
75.0000 mg | ORAL_TABLET | Freq: Every day | ORAL | 0 refills | Status: DC
  Filled 2023-07-01: qty 19, 19d supply, fill #0

## 2023-07-01 MED ORDER — ATORVASTATIN CALCIUM 80 MG PO TABS
80.0000 mg | ORAL_TABLET | Freq: Every day | ORAL | 0 refills | Status: DC
  Filled 2023-07-01: qty 30, 30d supply, fill #0

## 2023-07-01 MED ORDER — ASPIRIN 81 MG PO TBEC
81.0000 mg | DELAYED_RELEASE_TABLET | Freq: Every day | ORAL | 0 refills | Status: DC
  Filled 2023-07-01: qty 120, 120d supply, fill #0

## 2023-07-01 MED ORDER — FOLIC ACID 1 MG PO TABS: 1.0000 mg | ORAL_TABLET | Freq: Every day | ORAL | 0 refills | Status: DC

## 2023-07-01 MED ORDER — ASPIRIN 81 MG PO TBEC: 81.0000 mg | DELAYED_RELEASE_TABLET | Freq: Every day | ORAL | 0 refills | Status: DC

## 2023-07-01 MED ORDER — LOSARTAN POTASSIUM 25 MG PO TABS
25.0000 mg | ORAL_TABLET | Freq: Every day | ORAL | 0 refills | Status: DC
  Filled 2023-07-01: qty 30, 30d supply, fill #0

## 2023-07-01 MED ORDER — THIAMINE HCL 100 MG PO TABS
100.0000 mg | ORAL_TABLET | Freq: Every day | ORAL | 0 refills | Status: DC
  Filled 2023-07-01: qty 30, 30d supply, fill #0

## 2023-07-01 MED ORDER — VITAMIN B-1 100 MG PO TABS: 100.0000 mg | ORAL_TABLET | Freq: Every day | ORAL | 0 refills | Status: DC

## 2023-07-01 NOTE — Progress Notes (Signed)
Discharge instructions given. Patient verbalized understanding and all questions were answered.  ?

## 2023-07-01 NOTE — Discharge Summary (Signed)
Name: Blake Hughes MRN: [id number] DOB: 1966-04-13 57 y.o. PCP: Patient, No Pcp Per  Date of Admission: 06/30/2023  8:05 AM Date of Discharge: 07/01/2023 7:18 PM Attending Physician: Dr. Antony Contras  Discharge Diagnosis: Principal Problem:   Acute loss of vision, right Active Problems:   Tobacco abuse   Alcohol abuse   CAD (coronary artery disease)   HTN (hypertension)    Discharge Medications: Allergies as of 07/01/2023       Reactions   Codeine Nausea And Vomiting        Medication List     STOP taking these medications    metoprolol tartrate 25 MG tablet Commonly known as: LOPRESSOR   ticagrelor 90 MG Tabs tablet Commonly known as: BRILINTA       TAKE these medications    aspirin EC 81 MG tablet Take 1 tablet (81 mg total) by mouth daily. Swallow whole. Start taking on: July 02, 2023 What changed: additional instructions   atorvastatin 80 MG tablet Commonly known as: Lipitor Take 1 tablet (80 mg total) by mouth daily. What changed: when to take this   clopidogrel 75 MG tablet Commonly known as: PLAVIX Take 1 tablet (75 mg total) by mouth daily. Start taking on: July 02, 2023   folic acid 1 MG tablet Commonly known as: FOLVITE Take 1 tablet (1 mg total) by mouth daily. Start taking on: July 02, 2023   losartan 25 MG tablet Commonly known as: Cozaar Take 1 tablet (25 mg total) by mouth daily.   multivitamin with minerals Tabs tablet Take 1 tablet by mouth daily. Start taking on: July 02, 2023   nitroGLYCERIN 0.4 MG SL tablet Commonly known as: NITROSTAT Place 1 tablet (0.4 mg total) under the tongue every 5 (five) minutes as needed for chest pain (For up to three doses in 24 hours). What changed:  when to take this reasons to take this   thiamine 100 MG tablet Commonly known as: VITAMIN B1 Take 1 tablet (100 mg total) by mouth daily. Start taking on: July 02, 2023        Disposition and follow-up:   Blake Hughes was  discharged from [facility] in Good condition.  At the hospital follow up visit please address:  1.  Follow-up:  *Right central retinal artery occlusion *Moderate stenosis of the right cavernous ICA  *Chronic small-vessel ischemic changes affecting the pons, thalami, basal ganglia and left caudate head. -Ensure patient followed up with ophthalmology on 07/02/23 at 10:10am   -Follow up with appointment  -Please ensure adherence to new medications:  -Plavix: he will complete 21 days total, last day dose will be on 8/13  -Aspirin 81mg : indefinitely -Losartan 25mg : for HTN control -Atorvastatin 80mg   -Please obtain BMP within 10-14 days after beginning losartan -Please ensure/assist patient with setting up neurology appointment within 8 weeks    *Hypertension *Left ventricular diastolic parameters are consistent with Grade I diastolic dysfunction -Please ensure adherence to losartan 25mg  -Please obtain BMP within 10-14 days after beginning losartan -Encourage patient to avoid salt in diet -Encourage patient to check BP daily   *Hyperlipidemia -Please ensure adherence to atorvastatin 80mg  -Please check lipid panel in 6 weeks -Encourage healthy diet, consider dietician   *Tobacco Use -Offer resources on cessation  *Coronary artery disease *vertebral artery stenosis, severe at V4 segment *carotid plaque w/o significant stenosis *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  Labs / imaging needed at time of follow-up: Fasting lipid panel, cmp,cbc, ekg  3.  Pending labs/ test needing follow-up: rapid urine drug screen,   4.  Medication Changes  STOPPED  -Metoprolol tartrate (pt reported not taking)  -Ticagrelor (pt reported not taking)     ADDED  -Plavix/clopidogrel: he will complete 21 days total, last day dose will be on 8/13  -Aspirin 81mg : indefinitely -Losartan 25mg  -Atorvastatin 80mg    -folic acid  -multivitamin  -thiamine   -nitroglycerin     Follow-up Appointments:  Follow-up Information     [facility] Schedule an appointment as soon as possible for a visit.   Why: call to schedule an appointment  OR::  [facility] Location: 11[place]97 miles from [place]  [address] [place], [place] [address] [phone] Contact information: [facility] Location: 12.20 miles from [place]  [address] [place], [place] [address] [phone]        [facility] Follow up in 8 week(s).   Why: Call to make an appointment for 8 weeks from now. Contact information: [address]     [address] [place] [address] [phone]                Hospital Course by problem list:  CRAO Patient presented to the ED on 7/24 with 24 hours of painless acute near total vision loss in his right eye. Neurology was consulted and was suspicious of CRAO. Ophthalmology was called and recommended outpatient follow up and no further therapy other than stroke work up. CTA of head and neck showed no acute intracranial pathology, small remote infarcts in the left caudate and lentiform nuclei, calcified plaque at the carotid bifurcations without significant stenosis, moderate stenosis of the right vertebral artery origin, and severe stenosis in the right  V4 segment, moderate stenosis of the right cavernous ICA. MRI of brain showed chronic small vessel ischemic changes affecting the pons, thalami, basal ganglia, and left caudate head. Echocardiogram showed no atrial level shunt detected by color flow Doppler. Work up was done for other risk factors. Ha1c was 5.3. Lipid panel showed LDL of 117 and TC of 209. Will begin atorvastatin 80mg .   Hypertension- Patient has a history of high blood pressure for which he claims he has never taken medication. Medication was held during hospitalization for permissive hypertension window of 48 hours. BP 140/94 on discharge: Will send home with losartan 25mg  and recommend outpatient follow up with PCP.  CAD s/p stent 2013 Patient has a history of prior MI and known CAD with a stent placed in the LAD in 2013. Patient has never taken any medication for this. Patient reports that he has frequent chest pain with exertion but has no symptoms here. EKG was clear and showed no acute ischemic changes and an old anteroseptal infarct. Echo was ordered and showed stage LVEF of 60-65% with 1 diastolic dysfunction. Will discharge patient on atorvastatin 80mg , losartan 25mg   and aspirin 81mg .   Tobacco use  Patient reports a 40+ pack year history of smoking. Patient was offered nicotine patches for his hospital stay but declined.   Alcohol use Patient reports drinking 8-9 beers per day. Patient had a CAGE score of 3 but refused supportive materials for alcohol cessation. LFT's were wnl. Patient was placed on CIWA w/out ativan but scores remained a 0 throughout his hospitalization. Patient was counseled on alcohol cessation.  Patient received thiamine, folic acid, and multivitamins.      Discharge Subjective: Patient reports that he is feeling well today. He says that his vision improved very slightly, he can now see a little bit out of his peripheral vision. He says that he only really sees blue or red dots on his right peripheral  field. The patient says he knows he needs to see doctors after his hospitalization and is concerned about whether his vision will return. He denies any other symptoms at present including headache, chest pain, SOB, other vision changes, weakness, numbness or tingling.  Discharge Exam:   Blood pressure (!) 140/94, pulse 84, temperature 98.8 F (37.1 C), temperature source Oral, resp. rate 18, height 5\' 9"  (1.753 m), weight 69.9 kg, SpO2 93%.  Constitutional:non ill-appearing,  sitting in bed, in no acute distress Cardiovascular: regular rate and rhythm, no m/r/g Pulmonary/Chest: normal work of breathing on room air, lungs clear to auscultation bilaterally. No crackles  Abdominal: soft, non-tender, non-distended, BS+ Neurological: alert & oriented x 3, CN 5-12 intact, strength and sensation intake in all four limbs HEENT: Eyes- PERRLA. Patients visual acuity uncorrected is 20/200 in the left eye. Patient is not able to see the vision chart with his right eye. Patient has no vision to hand wave at 5, 2, or 1 foot. Patient is able to see pen light in his right peripheral field as a blue dot.  MSK: no gross abnormalities. Skin: warm and dry Psych: Normal mood and affect  Pertinent Labs, Studies, and Procedures:     Latest Ref Rng & Units 07/01/2023    3:01 AM 06/30/2023    9:26 AM 07/04/2018   12:44 AM  CBC  WBC 4.0 - 10.5 K/uL 6.6  4.9  6.9   Hemoglobin 13.0 - 17.0 g/dL 16.1  09.6  04.5   Hematocrit 39.0 - 52.0 % 42.1  44.9  42.2   Platelets 150 - 400 K/uL 206  222  184        Latest Ref Rng & Units 07/01/2023    3:01 AM 06/30/2023    9:26 AM 07/03/2018    6:21 AM  CMP  Glucose 70 - 99 mg/dL 409  811    BUN 6 - 20 mg/dL 10  10    Creatinine 9.14 - 1.24 mg/dL 7.82  9.56  2.13   Sodium 135 - 145 mmol/L 132  137    Potassium 3.5 - 5.1 mmol/L 4.4  4.4    Chloride 98 - 111 mmol/L 96  108    CO2 22 - 32 mmol/L 27  25    Calcium 8.9 - 10.3 mg/dL 9.0  8.8    Total Protein 6.5 - 8.1 g/dL 6.3    6.9   Total Bilirubin 0.3 - 1.2 mg/dL 1.0   0.6   Alkaline Phos 38 - 126 U/L 58   61   AST 15 - 41 U/L 28   25   ALT 0 - 44 U/L 36   26     ECHOCARDIOGRAM COMPLETE  Result Date: 07/01/2023  ECHOCARDIOGRAM REPORT   Patient Name:   ZACCHEAUS STORLIE Date of Exam: 07/01/2023 Medical Rec #:  [id number]          Height:       69.0 in Accession #:    [id number]         Weight:       154.0 lb Date of Birth:  [birth year]          BSA:          1.849 m Patient Age:    57 years           BP:           159/77 mmHg Patient Gender: M                  HR:           67 bpm. Exam Location:  Inpatient Procedure: 2D Echo, Cardiac Doppler and Color Doppler Indications:    Stroke  History:        Patient has no prior history of Echocardiogram examinations.                 CAD, Stroke; Risk Factors:Former Smoker, Hypertension and                 Dyslipidemia. ETOH.  Sonographer:    Sheralyn Boatman RDCS Referring Phys: [id number] CAROLYN GUILLOUD IMPRESSIONS  1. Left ventricular ejection fraction, by estimation, is 60 to 65%. The left ventricle has normal function. The left ventricle has no regional wall motion abnormalities. Left ventricular diastolic parameters are consistent with Grade I diastolic dysfunction (impaired relaxation).  2. Right ventricular systolic function is normal. The right ventricular size is normal.  3. The mitral valve is degenerative. Trivial mitral valve regurgitation. No evidence of mitral stenosis.  4. The aortic valve is tricuspid. There is mild calcification of the aortic valve. Aortic valve regurgitation is not visualized. No aortic stenosis is present.  5. The inferior vena cava is normal in size with greater than 50% respiratory variability, suggesting right atrial pressure of 3 mmHg. FINDINGS  Left Ventricle: Left ventricular ejection fraction, by estimation, is 60 to 65%. The left ventricle has normal function. The left ventricle has no regional wall motion abnormalities. The left ventricular internal  cavity size was normal in size. There is  no left ventricular hypertrophy. Left ventricular diastolic parameters are consistent with Grade I diastolic dysfunction (impaired relaxation). Right Ventricle: The right ventricular size is normal. No increase in right ventricular wall thickness. Right ventricular systolic function is normal. Left Atrium: Left atrial size was normal in size. Right Atrium: Right atrial size was normal in size. Pericardium: There is no evidence of pericardial effusion. Mitral Valve: The mitral valve is degenerative in appearance. Trivial mitral valve regurgitation. No evidence of mitral valve stenosis. Tricuspid Valve: The tricuspid valve is normal in structure. Tricuspid valve regurgitation is not demonstrated. No evidence of tricuspid stenosis. Aortic Valve: The aortic valve is tricuspid. There is mild calcification of the aortic valve. Aortic valve regurgitation is not visualized. No aortic stenosis is present. Pulmonic Valve: The pulmonic valve was normal in structure. Pulmonic valve regurgitation is not visualized. No evidence of pulmonic stenosis. Aorta: The aortic root and ascending aorta are structurally normal, with no evidence of dilitation. Venous: The inferior vena cava is normal in size with greater than 50% respiratory variability, suggesting right atrial pressure of 3 mmHg. IAS/Shunts: No atrial level shunt detected by color flow Doppler.  LEFT VENTRICLE  PLAX 2D LVIDd:         4.80 cm      Diastology LVIDs:         3.30 cm      LV e' medial:    7.07 cm/s LV PW:         1.00 cm      LV E/e' medial:  7.2 LV IVS:        0.90 cm      LV e' lateral:   12.50 cm/s LVOT diam:     2.10 cm      LV E/e' lateral: 4.1 LV SV:         47 LV SV Index:   26 LVOT Area:     3.46 cm  LV Volumes (MOD) LV vol d, MOD A2C: 102.0 ml LV vol d, MOD A4C: 66.0 ml LV vol s, MOD A2C: 40.9 ml LV vol s, MOD A4C: 23.9 ml LV SV MOD A2C:     61.1 ml LV SV MOD A4C:     66.0 ml LV SV MOD BP:      53.7 ml RIGHT  VENTRICLE            IVC RV S prime:     9.14 cm/s  IVC diam: 1.90 cm TAPSE (M-mode): 1.8 cm LEFT ATRIUM             Index        RIGHT ATRIUM           Index LA diam:        3.30 cm 1.78 cm/m   RA Area:     10.10 cm LA Vol (A2C):   29.7 ml 16.06 ml/m  RA Volume:   22.20 ml  12.01 ml/m LA Vol (A4C):   30.3 ml 16.39 ml/m LA Biplane Vol: 31.7 ml 17.15 ml/m  AORTIC VALVE LVOT Vmax:   75.30 cm/s LVOT Vmean:  47.900 cm/s LVOT VTI:    0.137 m  AORTA Ao Root diam: 3.50 cm Ao Asc diam:  2.95 cm MITRAL VALVE MV Area (PHT): 3.37 cm    SHUNTS MV Decel Time: 225 msec    Systemic VTI:  0.14 m MV E velocity: 51.10 cm/s  Systemic Diam: 2.10 cm MV A velocity: 56.60 cm/s MV E/A ratio:  0.90 Aditya Sabharwal Electronically signed by Dorthula Nettles Signature Date/Time: 07/01/2023/2:32:24 PM    Final    MR Brain W and Wo Contrast  Result Date: 06/30/2023 EXAM: MRI HEAD WITHOUT AND WITH CONTRAST TECHNIQUE: Multiplanar, multiecho pulse sequences of the brain and surrounding structures were obtained without and with intravenous contrast. CONTRAST:  7mL GADAVIST GADOBUTROL 1 MMOL/ML IV SOLN COMPARISON:  CT studies same day. FINDINGS: Brain: Diffusion imaging does not show any acute or subacute infarction. Mild chronic small-vessel ischemic changes present within the pons. No focal cerebellar insult. Cerebral hemispheres show mild chronic small-vessel ischemic change of the thalami and basal ganglia with an old lacunar infarction in the left caudate head. Minimal small vessel change of the cerebral hemispheric white matter. No cortical or large vessel territory infarction. No mass lesion, hemorrhage, hydrocephalus or extra-axial collection. After contrast administration, no abnormal enhancement occurs. Vascular: Major vessels at the base of the brain show flow. Skull and upper cervical spine: Negative Sinuses/Orbits: Clear/normal Other: None IMPRESSION: No acute or reversible finding. Chronic small-vessel ischemic changes  affecting the pons, thalami, basal ganglia and left caudate head. Electronically Signed   By: Paulina Fusi M.D.   On:  06/30/2023 15:07   CT ANGIO HEAD NECK W WO CM  Result Date: 06/30/2023 CLINICAL DATA:  Vision loss EXAM: CT ANGIOGRAPHY HEAD AND NECK WITH AND WITHOUT CONTRAST TECHNIQUE: Multidetector CT imaging of the head and neck was performed using the standard protocol during bolus administration of intravenous contrast. Multiplanar CT image reconstructions and MIPs were obtained to evaluate the vascular anatomy. Carotid stenosis measurements (when applicable) are obtained utilizing NASCET criteria, using the distal internal carotid diameter as the denominator. RADIATION DOSE REDUCTION: This exam was performed according to the departmental dose-optimization program which includes automated exposure control, adjustment of the mA and/or kV according to patient size and/or use of iterative reconstruction technique. CONTRAST:  75mL OMNIPAQUE IOHEXOL 350 MG/ML SOLN COMPARISON:  None Available. FINDINGS: CT HEAD FINDINGS Brain: There is no acute intracranial hemorrhage, extra-axial fluid collection, or acute infarct Parenchymal volume is normal. The ventricles are normal in size. Gray-white differentiation is preserved. There are small remote infarcts in the left caudate and lentiform nuclei. No cortical encephalomalacia is seen. The pituitary and suprasellar region are normal. There is no mass lesion. There is no mass effect or midline shift. Vascular: Low. Skull: Normal. Negative for fracture or focal lesion. Sinuses/Orbits: The paranasal sinuses are clear. The globes and orbits are unremarkable. Other: The mastoid air cells and middle ear cavities are clear. Review of the MIP images confirms the above findings CTA NECK FINDINGS Aortic arch: There is a minimal calcified plaque in the imaged aortic arch. The origins of the major branch vessels are patent. The subclavian arteries are patent to the level imaged.  Right carotid system: The right common, internal, and external carotid arteries are patent, with mild plaque at the bifurcation and in the proximal internal carotid artery resulting in less than 50% stenosis. There is no evidence of dissection or aneurysm. Left carotid system: The left common, internal, and external carotid arteries are patent, with mild plaque in the common carotid artery, at the bifurcation, and in the proximal internal carotid artery resulting in less than 50% stenosis. There is no evidence of dissection or aneurysm. Vertebral arteries: There is moderate stenosis of the right vertebral artery at its origin and mild plaque in the V2 segment. There is mild plaque in the proximal left V1 segment which arises directly from the aortic arch. There is no evidence of dissection or aneurysm. Skeleton: There is no acute osseous abnormality or suspicious osseous lesion. There is multilevel degenerative endplate change in the cervical spine. There is no visible canal hematoma. Other neck: The soft tissues of the neck are unremarkable. Upper chest: The imaged lung apices are clear. Review of the MIP images confirms the above findings CTA HEAD FINDINGS Anterior circulation: There is calcified plaque in the intracranial ICAs resulting in moderate stenosis of the right cavernous segment and no significant stenosis on the left. The bilateral MCAs are patent, without proximal stenosis or occlusion. The right A1 segment is hypoplastic/absent, a developmental variant. Otherwise, the ACAS are patent, without proximal stenosis or occlusion. The anterior communicating artery is normal There is no aneurysm or AVM. Posterior circulation: There is calcified plaque in the right V4 segment with up to severe stenosis just proximal to the plaque ([phone]). The V4 segment is patent to the vertebrobasilar junction. The left V4 segment is patent, without significant stenosis or occlusion. The basilar artery is patent. The major  cerebellar arteries appear patent. The bilateral PCAs are patent, without proximal stenosis or occlusion. Posterior communicating arteries are not identified. There  is no aneurysm or AVM. Venous sinuses: Patent. Anatomic variants: As above. Review of the MIP images confirms the above findings IMPRESSION: 1. No acute intracranial pathology. 2. 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 3. Calcified plaque at the carotid bifurcations without significant stenosis. 4. Moderate stenosis of the right vertebral artery origin, and severe stenosis in the right V4 segment. 5. 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 Electronically Signed   By: Lesia Hausen M.D.   On: 06/30/2023 11:55     Discharge Instructions: Discharge Instructions     (HEART FAILURE PATIENTS) Call MD:  Anytime you have any of the following symptoms: 1) 3 pound weight gain in 24 hours or 5 pounds in 1 week 2) shortness of breath, with or without a dry hacking cough 3) swelling in the hands, feet or stomach 4) if you have to sleep on extra pillows at night in order to breathe.   Complete by: As directed    (HEART FAILURE PATIENTS) Call MD:  Anytime you have any of the following symptoms: 1) 3 pound weight gain in 24 hours or 5 pounds in 1 week 2) shortness of breath, with or without a dry hacking cough 3) swelling in the hands, feet or stomach 4) if you have to sleep on extra pillows at night in order to breathe.   Complete by: As directed    Call MD for:  difficulty breathing, headache or visual disturbances   Complete by: As directed    Call MD for:  difficulty breathing, headache or visual disturbances   Complete by: As directed    Call MD for:  extreme fatigue   Complete by: As directed    Call MD for:  extreme fatigue   Complete by: As directed    Call MD for:  hives   Complete by: As directed    Call MD for:  hives   Complete by: As directed    Call MD for:  persistant dizziness or light-headedness    Complete by: As directed    Call MD for:  persistant dizziness or light-headedness   Complete by: As directed    Call MD for:  persistant nausea and vomiting   Complete by: As directed    Call MD for:  persistant nausea and vomiting   Complete by: As directed    Call MD for:  redness, tenderness, or signs of infection (pain, swelling, redness, odor or green/yellow discharge around incision site)   Complete by: As directed    Call MD for:  redness, tenderness, or signs of infection (pain, swelling, redness, odor or green/yellow discharge around incision site)   Complete by: As directed    Call MD for:  severe uncontrolled pain   Complete by: As directed    Call MD for:  severe uncontrolled pain   Complete by: As directed    Call MD for:  temperature >100.4   Complete by: As directed    Call MD for:  temperature >100.4   Complete by: As directed    Diet - low sodium heart healthy   Complete by: As directed    Discharge instructions   Complete by: As directed    Blake Hughes,  It was a pleasure taking care of you. You were admitted to the hospital on 06/30/2023 for acute vision loss in your right eye. In the ED you were worked up for a stroke and imaging ruled out a stroke. Neurology was consulted and ophthalmology  were called, with the most likely diagnosis being CRAO (Central retinal artery occlusion). An echocardiogram was done to evaluate your heart and found evidence of Heart failure with preserved ejection fraction. A hemoglobin A1c test was done to evaluate for diabetes and was 5.3% indicating that you are not diabetic.   Key steps to keep you healthy!   For your CRAO (central retinal artery occlusion) - Take aspirin 81mg  every day  - take Plavix 75mg  every day (starting tomorrow) for the next 19 days - Manage your cholesterol and blood pressure as per instructions below. This is very important to reduce your risk of future incidents.  - follow up with the neurology stroke clinic  in 8 weeks - follow up with the Internal Medicine Clinic in the ground floor of [facility]. They will become your new Primary care provider (PCP). Please call at [phone]  For your Coronary artery disease- - take aspirin 81mg  every day  For you Hypertension - starting tomorrow, take losartan 25mg  every day  - follow up with internal medicine clinic to figure out the best dosage to control your blood pressure.  For your Hyperlipidemia - start atorvastatin 80mg  every night. If you experience muscle aches, follow up with your PCP. -follow up with your primary care provider in 7-10 days   If you have any questions or concerns, please call us at [phone].   If you have these following symptoms, please go to an emergency department: -chest pain -shortness of breath -Sudden weakness in one arm -Sudden weakness in face -Slurred speech -Numbness or tingling that is new -additional vision changes or loss of vision in your left eye  Take Care!  Faith Rogue DO   Increase activity slowly   Complete by: As directed    Increase activity slowly   Complete by: As directed        Signed: Faith Rogue DO [facility] - PGY1 Pager: [phone] 07/01/2023, 7:18 PM    Please contact the on call pager after 5 pm and on weekends at [phone].

## 2023-07-01 NOTE — Progress Notes (Signed)
  Echocardiogram 2D Echocardiogram has been performed.  Blake Hughes 07/01/2023, 10:58 AM

## 2023-07-01 NOTE — TOC Transition Note (Signed)
Transition of Care [facility]) - CM/SW Discharge Note   Patient Details  Name: Blake Hughes MRN: [id number] Date of Birth: 1966-02-23  Transition of Care [facility]) CM/SW Contact:  Kermit Balo, RN Phone Number: 07/01/2023, 4:29 PM   Clinical Narrative:     Pt is discharging home with self care. No f/u per therapies. MATCH provided to assist with the cost of his medications as he has no insurance.  Son will transport home.  Final next level of care: Home/Self Care Barriers to Discharge: Inadequate or no insurance, Barriers Unresolved (comment)   Patient Goals and CMS Choice   Choice offered to / list presented to : Patient  Discharge Placement                         Discharge Plan and Services Additional resources added to the After Visit Summary for     Discharge Planning Services: CM Consult                                 Social Determinants of Health (SDOH) Interventions SDOH Screenings   Food Insecurity: No Food Insecurity (06/30/2023)  Housing: Low Risk  (06/30/2023)  Transportation Needs: No Transportation Needs (06/30/2023)  Utilities: Not At Risk (06/30/2023)  Tobacco Use: High Risk (06/30/2023)     Readmission Risk Interventions     No data to display

## 2023-07-01 NOTE — TOC Initial Note (Signed)
Transition of Care [facility]) - Initial/Assessment Note    Patient Details  Name: Blake Hughes MRN: [id number] Date of Birth: 1966/06/06  Transition of Care [facility]) CM/SW Contact:    Kermit Balo, RN Phone Number: 07/01/2023, 12:25 PM  Clinical Narrative:                 Pt is from home alone. He works out of town a lot and is usually gone for about 2 weeks at a time.  Son can check on him when he is at home.  No health insurance and no PCP. CM has sent email to financial counseling to see if he will qualify for medicaid. CM has added some low cost medical clinics to AVS for pt to call and schedule an appointment. CM has provided him a good rx card to assist with the cost of meds after the first month. At d/c from hospital CM will assess the need for [facility]. No f/u per PT. TOC following.  Expected Discharge Plan: Home/Self Care Barriers to Discharge: Inadequate or no insurance, Continued Medical Work up   Patient Goals and CMS Choice     Choice offered to / list presented to : Patient      Expected Discharge Plan and Services   Discharge Planning Services: CM Consult   Living arrangements for the past 2 months: Single Family Home                                      Prior Living Arrangements/Services Living arrangements for the past 2 months: Single Family Home Lives with:: Self Patient language and need for interpreter reviewed:: Yes Do you feel safe going back to the place where you live?: Yes            Criminal Activity/Legal Involvement Pertinent to Current Situation/Hospitalization: No - Comment as needed  Activities of Daily Living Home Assistive Devices/Equipment: None ADL Screening (condition at time of admission) Patient's cognitive ability adequate to safely complete daily activities?: Yes Is the patient deaf or have difficulty hearing?: No Does the patient have difficulty seeing, even when wearing glasses/contacts?: Yes Does the patient have  difficulty concentrating, remembering, or making decisions?: No Patient able to express need for assistance with ADLs?: Yes Does the patient have difficulty dressing or bathing?: No Independently performs ADLs?: Yes (appropriate for developmental age) Does the patient have difficulty walking or climbing stairs?: No Weakness of Legs: None Weakness of Arms/Hands: None  Permission Sought/Granted                  Emotional Assessment Appearance:: Appears stated age Attitude/Demeanor/Rapport: Engaged Affect (typically observed): Accepting Orientation: : Oriented to Self, Oriented to Place, Oriented to  Time, Oriented to Situation   Psych Involvement: No (comment)  Admission diagnosis:  Vision loss of right eye [H54.61] Acute loss of vision, right [H53.131] Patient Active Problem List   Diagnosis Date Noted   CAD (coronary artery disease) 07/01/2023   HTN (hypertension) 07/01/2023   Acute loss of vision, right 06/30/2023   Tobacco abuse 07/03/2018   Alcohol abuse 07/03/2018   PCP:  Patient, No Pcp Per Pharmacy:   [facility] - [place], [place] - [address] [place] [address] Phone: [phone] Fax: [phone]     Social Determinants of Health (SDOH) Social History: SDOH Screenings   Food Insecurity: No Food Insecurity (06/30/2023)  Housing: Low Risk  (06/30/2023)  Transportation Needs: No Transportation Needs (06/30/2023)  Utilities: Not At Risk (06/30/2023)  Tobacco Use: High Risk (06/30/2023)   SDOH Interventions:     Readmission Risk Interventions     No data to display

## 2023-07-01 NOTE — Progress Notes (Addendum)
STROKE TEAM PROGRESS NOTE   BRIEF HPI Mr. Blake Hughes is a 57 y.o. male with history of CAD and smoking presenting with acute vision loss in the right eye.  Patient states that some of his vision has returned, but still is not back to normal.  He was noted to have not taken his medications at home, likely due to difficulty affording them.   INTERIM HISTORY/SUBJECTIVE Patient reports that he has some vision in his right eye but that it is not nearly back to normal yet.  He has been hemodynamically stable, and his neurological exam is otherwise unchanged.  He verbalizes concerns about his employment.   OBJECTIVE  CBC    Component Value Date/Time   WBC 6.6 07/01/2023 0301   RBC 4.50 07/01/2023 0301   HGB 14.4 07/01/2023 0301   HCT 42.1 07/01/2023 0301   PLT 206 07/01/2023 0301   MCV 93.6 07/01/2023 0301   MCH 32.0 07/01/2023 0301   MCHC 34.2 07/01/2023 0301   RDW 12.1 07/01/2023 0301    BMET    Component Value Date/Time   NA 132 (L) 07/01/2023 0301   K 4.4 07/01/2023 0301   CL 96 (L) 07/01/2023 0301   CO2 27 07/01/2023 0301   GLUCOSE 107 (H) 07/01/2023 0301   BUN 10 07/01/2023 0301   CREATININE 1.00 07/01/2023 0301   CALCIUM 9.0 07/01/2023 0301   GFRNONAA >60 07/01/2023 0301    IMAGING past 24 hours MR Brain W and Wo Contrast  Result Date: 06/30/2023 EXAM: MRI HEAD WITHOUT AND WITH CONTRAST TECHNIQUE: Multiplanar, multiecho pulse sequences of the brain and surrounding structures were obtained without and with intravenous contrast. CONTRAST:  7mL GADAVIST GADOBUTROL 1 MMOL/ML IV SOLN COMPARISON:  CT studies same day. FINDINGS: Brain: Diffusion imaging does not show any acute or subacute infarction. Mild chronic small-vessel ischemic changes present within the pons. No focal cerebellar insult. Cerebral hemispheres show mild chronic small-vessel ischemic change of the thalami and basal ganglia with an old lacunar infarction in the left caudate head. Minimal small vessel  change of the cerebral hemispheric white matter. No cortical or large vessel territory infarction. No mass lesion, hemorrhage, hydrocephalus or extra-axial collection. After contrast administration, no abnormal enhancement occurs. Vascular: Major vessels at the base of the brain show flow. Skull and upper cervical spine: Negative Sinuses/Orbits: Clear/normal Other: None IMPRESSION: No acute or reversible finding. Chronic small-vessel ischemic changes affecting the pons, thalami, basal ganglia and left caudate head. Electronically Signed   By: Paulina Fusi M.D.   On: 06/30/2023 15:07    Vitals:   06/30/23 1929 07/01/23 0010 07/01/23 0310 07/01/23 0841  BP: (!) 159/89 (!) 150/92 (!) 155/84 (!) 159/77  Pulse: 74 65 65 62  Resp: 19 18 18 17   Temp: 98.4 F (36.9 C) 98 F (36.7 C) 97.9 F (36.6 C) 97.6 F (36.4 C)  TempSrc: Oral Oral Oral Oral  SpO2: 96% 99% 97% 100%  Weight:      Height:         PHYSICAL EXAM General:  Alert, well-nourished, well-developed patient in no acute distress Psych:  Mood and affect appropriate for situation CV: Regular rate and rhythm on monitor Respiratory:  Regular, unlabored respirations on room air GI: Abdomen soft and nontender   NEURO:  Mental Status: AA&Ox3, patient is able to give clear and coherent history Speech/Language: speech is without dysarthria or aphasia.    Cranial Nerves:  II: PERRL.  Decreased vision in right eye III, IV, VI:  EOMI. Eyelids elevate symmetrically.  VII: Face is symmetrical resting and smiling VIII: hearing intact to voice. IX, X: Phonation is normal.  XII: tongue is midline without fasciculations. Motor: 5/5 strength to all muscle groups tested.  Tone: is normal and bulk is normal Sensation- Intact to light touch bilaterally. Extinction absent to light touch to DSS.   Coordination: FTN intact bilaterally.No drift.  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 CTA head & neck ossified plaque at carotid bifurcations without significant stenosis, moderate stenosis of right vertebral origin and severe stenosis of right V4 segment, moderate stenosis of right cavernous ICA MRI no acute abnormality, chronic small vessel ischemic changes 2D Echo pending LDL 117 HgbA1c 5.3 VTE prophylaxis -Lovenox No antithrombotic prior to admission, now on aspirin 81 mg daily and clopidogrel 75 mg daily  Therapy recommendations:  No follow up needed Disposition: Home  Hypertension Home meds: None Stable Blood Pressure Goal: BP less than 220/110   Hyperlipidemia Home meds: None LDL 117, goal < 70 Add atorvastatin 80 mg daily Continue statin at discharge  Tobacco Abuse Patient smokes 1 packs per day for 30 years       Ready to quit? Yes Nicotine replacement therapy provided  Other Stroke Risk Factors Coronary artery disease   Other Active Problems None  Hospital day # 0  Cortney E Ernestina Columbia , MSN, AGACNP-BC [facility] See Amion for schedule and pager information 07/01/2023 11:16 AM   ATTENDING ATTESTATION:  57 year old with right CRAO.  Still with visual deficit in the right eye on exam.  DAPT therapy for 3 weeks then aspirin alone.  MRI negative for stroke.  He is a heavy smoker.  Counseled him again today on tobacco cessation.  Discussed diet and exercise.  Will give him FMLA note.  He is not drive or work until cleared by neurology outpt.  He will follow-up in stroke clinic in 6 to 8 weeks with Dr. Pearlean Brownie.  ADDENDUM: Echo 60-65%.   Discussed with Dr. Hessie Diener.  Dr. Viviann Spare evaluated pt independently, reviewed imaging, chart, labs. Discussed and formulated plan with the Resident/APP. Changes were made to the note where appropriate. Please see APP/resident note above for details.     Shirleen Mcfaul,MD   To contact Stroke Continuity provider, please refer to [email or web address]. After hours, contact General  Neurology

## 2023-07-01 NOTE — Discharge Instructions (Addendum)
*  Patient instructions*  Blake Hughes,  It was a pleasure taking care of you. You were admitted to the hospital on 06/30/2023 for acute vision loss in your right eye. In the ED you were worked up for a stroke and imaging ruled out a stroke. Neurology was consulted and ophthalmology were called, with the most likely diagnosis being CRAO (Central retinal artery occlusion). An echocardiogram was done to evaluate your heart and found evidence of Heart failure with preserved ejection fraction. A hemoglobin A1c test was done to evaluate for diabetes and was 5.3% indicating that you are not diabetic.   Key steps to keep you healthy!   For your CRAO (central retinal artery occlusion) - Take aspirin 81mg  every day - take Plavix 75mg  every day for 3 weeks and then stop - Manage your cholesterol and blood pressure as per instructions below. This is very important to reduce your risk of future incidents.  - follow up with the neurology stroke clinic in 8 weeks - follow up with the Internal Medicine Clinic in the ground floor of [facility]. They will become your new Primary care provider (PCP).  For your Coronary artery disease- - take aspirin 81mg  every day - take plavix 75mg  every day for 3 weeks and then stop  For you Hypertension - starting tomorrow, take losartan 25mg  every day  - follow up with internal medicine clinic to figure out the best dosage to control your blood pressure.  For your Hyperlipidemia - start atorvastatin 80mg  every night. If you experience muscle aches, follow up with your PCP. -follow up with your primary care provider in 7-10 days  If you have any questions or concerns, please call us at [phone].   If you have these following symptoms, please go to an emergency department: -chest pain -shortness of breath -Sudden weakness in one arm -Sudden weakness in face -Slurred speech -Numbness or tingling that is new -additional vision changes or loss of vision in  your left eye

## 2023-07-01 NOTE — Evaluation (Signed)
Occupational Therapy Evaluation Patient Details Name: Blake Hughes MRN: [id number] DOB: [birth year] Today's Date: 07/01/2023   History of Present Illness 57yo male who lost vision in his R eye suddenly on 06/29/23. Went to sleep hoping vision would return the next day, it did not so he presented to the ED today. Brain MRI negative for CVA, CTA negative for acute changes. PMH CAD s/p stent, HTN, HLD, tobacco use   Clinical Impression   PTA, pt lives alone, typically completely independent and employed in iron work. Pt presents now with primary deficit being impaired R vision. Initially, pt admitted with total vision loss but demonstrating some improvements in peripheral vision while central vision still impaired. Overall, pt able to manage ADLs and in-room mobility Independently. While out in hallway, pt does benefit from cues for direction, scanning to R for safety and locating destinations. Pt able to manage stairs without issues. Educated pt and family re: driving cessation until vision further improves, assistance for IADLs and compensatory strategies/safety precautions with current visual impairments. Anticipate no OT needs at DC but will continue to follow acutely.      Recommendations for follow up therapy are one component of a multi-disciplinary discharge planning process, led by the attending physician.  Recommendations may be updated based on patient status, additional functional criteria and insurance authorization.   Assistance Recommended at Discharge PRN  Patient can return home with the following Assistance with cooking/housework;Assist for transportation    Functional Status Assessment  Patient has had a recent decline in their functional status and demonstrates the ability to make significant improvements in function in a reasonable and predictable amount of time.  Equipment Recommendations  None recommended by OT    Recommendations for Other Services       Precautions  / Restrictions Precautions Precautions: Other (comment) Precaution Comments: impaired vision R eye Restrictions Weight Bearing Restrictions: No      Mobility Bed Mobility Overal bed mobility: Independent                  Transfers Overall transfer level: Independent Equipment used: None                      Balance Overall balance assessment: Modified Independent                                         ADL either performed or assessed with clinical judgement   ADL Overall ADL's : Modified independent                                       General ADL Comments: able to mobilize around room, has been going to/from bathroom, able to don tennis shoes MOD I. Out in hallway, minor cues/assist for direction when turning to R due to impaired vision. able to manage steps multiple times in gym with and without rail use and no safety concerns. some difficulty scanning and problem solving to locate room on return back from gym. educated on bringing targets from R side in to further exercise/assess vision improvements from periphery to midline. educated to avoid driving until vision improves and to follow up with eye MD     Vision Baseline Vision/History: 1 Wears glasses Ability to See in Adequate Light: 2 Moderately impaired Patient Visual Report:  Peripheral vision impairment;Central vision impairment Vision Assessment?: Yes;Vision impaired- to be further tested in functional context Eye Alignment: Within Functional Limits Ocular Range of Motion: Within Functional Limits Alignment/Gaze Preference: Within Defined Limits Tracking/Visual Pursuits: Able to track stimulus in all quads without difficulty Convergence: Within functional limits Visual Fields: Right visual field deficit Additional Comments: reports initial presentation with total vision loss in R eye. Eye motion/tracking WFL with both eyes open. Pt able to identify superior, inferior  and lateral peripheral targets when coming around from back fairly well though still a deficit in comparision to R eye. primarily central vision of R eye affected. able to turn head/scan to compensate. educated pt/family to have someone walking on R side with pt able to identify that therapist was beside him when out in hallway     Perception     Praxis      Pertinent Vitals/Pain Pain Assessment Pain Assessment: No/denies pain     Hand Dominance Right   Extremity/Trunk Assessment Upper Extremity Assessment Upper Extremity Assessment: Overall WFL for tasks assessed   Lower Extremity Assessment Lower Extremity Assessment: Defer to PT evaluation   Cervical / Trunk Assessment Cervical / Trunk Assessment: Normal   Communication Communication Communication: No difficulties   Cognition Arousal/Alertness: Awake/alert Behavior During Therapy: WFL for tasks assessed/performed Overall Cognitive Status: Within Functional Limits for tasks assessed                                       General Comments  Son at bedside and supportive    Exercises     Shoulder Instructions      Home Living Family/patient expects to be discharged to:: Private residence Living Arrangements: Alone Available Help at Discharge: Family;Available PRN/intermittently Type of Home: House Home Access: Stairs to enter [employer] of Steps: 3-4 Entrance Stairs-Rails: None Home Layout: One level     Bathroom Shower/Tub: Chief Strategy Officer: Standard     Home Equipment: None          Prior Functioning/Environment Prior Level of Function : Independent/Modified Independent             Mobility Comments: no use of AD ADLs Comments: does iron work for a living, active at baseline, has 16 chickens at home he takes care of        OT Problem List: Impaired vision/perception      OT Treatment/Interventions: Self-care/ADL training;Therapeutic  exercise;Visual/perceptual remediation/compensation;Therapeutic activities    OT Goals(Current goals can be found in the care plan section) Acute Rehab OT Goals Patient Stated Goal: for vision to further improve OT Goal Formulation: With patient Time For Goal Achievement: 07/15/23 Potential to Achieve Goals: Good  OT Frequency: Min 1X/week    Co-evaluation              AM-PAC OT "6 Clicks" Daily Activity     Outcome Measure Help from another person eating meals?: None Help from another person taking care of personal grooming?: None Help from another person toileting, which includes using toliet, bedpan, or urinal?: None Help from another person bathing (including washing, rinsing, drying)?: None Help from another person to put on and taking off regular upper body clothing?: None Help from another person to put on and taking off regular lower body clothing?: None 6 Click Score: 24   End of Session Equipment Utilized During Treatment: Gait belt Nurse Communication: Mobility status;Other (comment) (independent  in room)  Activity Tolerance: Patient tolerated treatment well Patient left: in chair;with call bell/phone within reach;with family/visitor present  OT Visit Diagnosis: Low vision, both eyes (H54.2)                Time: 4098-1191 OT Time Calculation (min): 31 min Charges:  OT General Charges $OT Visit: 1 Visit OT Evaluation $OT Eval Low Complexity: 1 Low OT Treatments $Therapeutic Activity: 8-22 mins  Bradd Canary, OTR/L Acute Rehab Services Office: [phone]   Lorre Munroe 07/01/2023, 12:41 PM

## 2023-07-01 NOTE — TOC CAGE-AID Note (Signed)
Transition of Care [facility]) - CAGE-AID Screening   Patient Details  Name: Blake Hughes MRN: [id number] Date of Birth: 1966/12/05  [facility] CM/SW Contact:    Kermit Balo, RN Phone Number: 07/01/2023, 12:17 PM   Clinical Narrative: Pt refused inpatient/ outpatient alcohol counseling resources.   CAGE-AID Screening:    Have You Ever Felt You Ought to Cut Down on Your Drinking or Drug Use?: Yes Have People Annoyed You By Critizing Your Drinking Or Drug Use?: Yes Have You Felt Bad Or Guilty About Your Drinking Or Drug Use?: Yes Have You Ever Had a Drink or Used Drugs First Thing In The Morning to Steady Your Nerves or to Get Rid of a Hangover?: No CAGE-AID Score: 3  Substance Abuse Education Offered: Yes (refused)

## 2023-07-01 NOTE — Progress Notes (Signed)
SLP Cancellation Note  Patient Details Name: Blake Hughes MRN: [id number] DOB: December 23, 1965   Cancelled treatment:       Reason Eval/Treat Not Completed: SLP screened, no needs identified, will sign off  Tashonda Pinkus L. Samson Frederic, MA CCC/SLP Clinical Specialist - Acute Care SLP Acute Rehabilitation Services Office number [phone]    Blenda Mounts Laurice 07/01/2023, 3:10 PM

## 2023-07-01 NOTE — Plan of Care (Signed)

## 2023-07-20 ENCOUNTER — Ambulatory Visit (INDEPENDENT_AMBULATORY_CARE_PROVIDER_SITE_OTHER): Payer: Self-pay | Admitting: Student

## 2023-07-20 ENCOUNTER — Other Ambulatory Visit: Payer: Self-pay

## 2023-07-20 ENCOUNTER — Encounter: Payer: Self-pay | Admitting: Student

## 2023-07-20 VITALS — BP 151/79 | HR 91 | Temp 97.5°F | Ht 69.0 in | Wt 151.3 lb

## 2023-07-20 DIAGNOSIS — E785 Hyperlipidemia, unspecified: Secondary | ICD-10-CM

## 2023-07-20 DIAGNOSIS — Z720 Tobacco use: Secondary | ICD-10-CM

## 2023-07-20 DIAGNOSIS — F1010 Alcohol abuse, uncomplicated: Secondary | ICD-10-CM

## 2023-07-20 DIAGNOSIS — I10 Essential (primary) hypertension: Secondary | ICD-10-CM

## 2023-07-20 DIAGNOSIS — H3411 Central retinal artery occlusion, right eye: Secondary | ICD-10-CM

## 2023-07-20 DIAGNOSIS — F17210 Nicotine dependence, cigarettes, uncomplicated: Secondary | ICD-10-CM

## 2023-07-20 NOTE — Assessment & Plan Note (Signed)
Patient has a history of excessive alcohol use.  When he was hospitalized, he reported drinking up to 8-9 beers a day.  During his hospitalization he did not experience withdrawal symptoms.  He stated that he has decreased his alcohol use to about 12 beers per week and he was sent home from the hospital with folic acid, thiamine, multivitamin. PLAN: -Discontinue folic acid, thiamine, multivitamin -Possible GI follow-up in the future for esophageal varices evaluation if patient is able to get insurance

## 2023-07-20 NOTE — Assessment & Plan Note (Signed)
During hospitalization, a lipid panel showed an elevated LDL of 117.  Patient was started on atorvastatin 80 mg.  Patient reports compliance with this medication.  Patient also reported an active lifestyle and a diet with excessive processed foods and fast foods. Plan -Continue atorvastatin 80 mg -Will hold on lipid profile, patient does not have health insurance at this moment -LDL goal for him would be less than 70  -Patient counseled on the importance of exercise and avoiding processed/fast foods

## 2023-07-20 NOTE — Patient Instructions (Addendum)
Please ask the neurologist if they accept patient's without insurance. Please ask the price of the visit beforehand.  Neurology number to call: [phone]

## 2023-07-20 NOTE — Assessment & Plan Note (Signed)
Patient presents today for hospitalization follow-up of a right central retinal artery occlusion.  During his hospitalization, he was started on dual antiplatelet therapy with Plavix and aspirin, hypertension control with losartan, lipid cholesterol control with atorvastatin.  Today, he reports compliance with aspirin and stated that he has a few days of Plavix left.  He also reported compliance with losartan however his blood pressure today was elevated at 151/79. PLAN: -Continue aspirin, finish the last doses of Plavix -Continue losartan: Will recheck BMP today and increase based on Cr -Patient was counseled on the importance of tobacco cessation, patient has decreased tobacco intake to 6 to 7 cigarettes from original 1-1 and half packs a day: Patient declines medications or resources -Patient is currently taking Lipitor with an LDL of 117, patient declined lipid profile due to lack of insurance -Patient was provided the neurology office phone number, patient will call and assess price of visit due to lack of insurance -Continue to follow-up with ophthalmology

## 2023-07-20 NOTE — Progress Notes (Signed)
New Patient Office Visit  Subjective    Patient ID: Blake Hughes, male    DOB: 07-21-1966  Age: 57 y.o. MRN: [id number]  CC:  Chief Complaint  Patient presents with   Transitions Of Care    HPI DERRECK Hughes is a 57 year old male  who presents to establish care and hospital follow up for a right central retinal artery occlusion.  Today, the patient's experiences loss of vision in the right eye.  He followed up with an ophthalmologist after hospitalization and reported that the eye doctor "saw something with the eyes again" and would like to follow-up with him again.  He is frustrated throughout the exam due to loss of vision.   Outpatient Encounter Medications as of 07/20/2023  Medication Sig   aspirin EC 81 MG tablet Take 1 tablet (81 mg total) by mouth daily. Swallow whole.   atorvastatin (LIPITOR) 80 MG tablet Take 1 tablet (80 mg total) by mouth daily.   clopidogrel (PLAVIX) 75 MG tablet Take 1 tablet (75 mg total) by mouth daily.   losartan (COZAAR) 25 MG tablet Take 1 tablet (25 mg total) by mouth daily.   nitroGLYCERIN (NITROSTAT) 0.4 MG SL tablet Place 1 tablet (0.4 mg total) under the tongue every 5 (five) minutes as needed for chest pain (For up to three doses in 24 hours).   [DISCONTINUED] folic acid (FOLVITE) 1 MG tablet Take 1 tablet (1 mg total) by mouth daily.   [DISCONTINUED] Multiple Vitamin (MULTIVITAMIN WITH MINERALS) TABS tablet Take 1 tablet by mouth daily.   [DISCONTINUED] thiamine (VITAMIN B1) 100 MG tablet Take 1 tablet (100 mg total) by mouth daily.   No facility-administered encounter medications on file as of 07/20/2023.    Past Medical History:  Diagnosis Date   Central retinal artery occlusion of right eye    Coronary artery disease    HTN (hypertension)     Past Surgical History:  Procedure Laterality Date   CARDIAC CATHETERIZATION     CORONARY ANGIOPLASTY     LEFT HEART CATHETERIZATION WITH CORONARY ANGIOGRAM N/A 10/11/2012    Procedure: LEFT HEART CATHETERIZATION WITH CORONARY ANGIOGRAM;  Surgeon: Robynn Pane, MD;  Location: [facility]ATH [facility];  Service: Cardiovascular;  Laterality: N/A;   right arm surgery      Family History  Problem Relation Age of Onset   CAD Mother    Diabetes Mellitus II Father    Diabetes Mellitus II Sister     Social History   Socioeconomic History   Marital status: Divorced    Spouse name: Not on file   Number of children: Not on file   Years of education: Not on file   Highest education level: Not on file  Occupational History   Not on file  Tobacco Use   Smoking status: Every Day    Current packs/day: 1.00    Average packs/day: 1 pack/day for 30.0 years (30.0 ttl pk-yrs)    Types: Cigarettes   Smokeless tobacco: Never  Substance and Sexual Activity   Alcohol use: Yes    Comment: Drinks 5 times a week beer.   Drug use: No   Sexual activity: Not on file  Other Topics Concern   Not on file  Social History Narrative   Not on file   Social Determinants of Health   Financial Resource Strain: Not on file  Food Insecurity: No Food Insecurity (06/30/2023)   Hunger Vital Sign    Worried About Running Out of  Food in the Last Year: Never true    Ran Out of Food in the Last Year: Never true  Transportation Needs: No Transportation Needs (06/30/2023)   PRAPARE - Administrator, Civil Service (Medical): No    Lack of Transportation (Non-Medical): No  Physical Activity: Not on file  Stress: Not on file  Social Connections: Not on file  Intimate Partner Violence: Not At Risk (06/30/2023)   Humiliation, Afraid, Rape, and Kick questionnaire    Fear of Current or Ex-Partner: No    Emotionally Abused: No    Physically Abused: No    Sexually Abused: No          Objective    BP (!) 151/79 (BP Location: Right Arm, Cuff Size: Normal) Comment: pt stated that he did  not take his meds this morning he takes them at  night  Pulse 91   Temp (!) 97.5 F (36.4 C)  (Oral)   Ht 5\' 9"  (1.753 m)   Wt 151 lb 4.8 oz (68.6 kg)   SpO2 98%   BMI 22.34 kg/m   Physical Exam Vitals reviewed.  Constitutional:      Appearance: He is normal weight.  Cardiovascular:     Rate and Rhythm: Normal rate and regular rhythm.     Heart sounds: Normal heart sounds.  Pulmonary:     Effort: Pulmonary effort is normal.     Breath sounds: Normal breath sounds.  Skin:    General: Skin is warm and dry.  Neurological:     Mental Status: He is alert and oriented to person, place, and time.  Psychiatric:        Attention and Perception: Attention normal.        Speech: Speech normal.        Behavior: Behavior is cooperative.     Comments: Frustrated.       Assessment & Plan:   Problem List Items Addressed This Visit       Cardiovascular and Mediastinum   HTN (hypertension) - Primary    Patient has a diagnosis of hypertension that was previously untreated prior to hospitalization.  During the hospitalization he was started on losartan 25 mg.  He reports compliance with medication.  His blood pressure today was 151/79.  Uncontrolled pretension is likely multifactorial from tobacco use, alcohol use, and diet was increased sodium, need for increase in medication. PLAN: -Recheck BMP today -Contingency plan is to increase losartan if serum creatinine is within normal limits -Patient advised to continue decreasing tobacco intake and alcohol intake -Patient was counseled on the importance of diet and exercise and decreasing sodium intake with hypertension      Relevant Orders   BMP8+Anion Gap   Central retinal artery occlusion of right eye    Patient presents today for hospitalization follow-up of a right central retinal artery occlusion.  During his hospitalization, he was started on dual antiplatelet therapy with Plavix and aspirin, hypertension control with losartan, lipid cholesterol control with atorvastatin.  Today, he reports compliance with aspirin and stated  that he has a few days of Plavix left.  He also reported compliance with losartan however his blood pressure today was elevated at 151/79. PLAN: -Continue aspirin, finish the last doses of Plavix -Continue losartan: Will recheck BMP today and increase based on Cr -Patient was counseled on the importance of tobacco cessation, patient has decreased tobacco intake to 6 to 7 cigarettes from original 1-1 and half packs a day: Patient declines medications  or resources -Patient is currently taking Lipitor with an LDL of 117, patient declined lipid profile due to lack of insurance -Patient was provided the neurology office phone number, patient will call and assess price of visit due to lack of insurance -Continue to follow-up with ophthalmology         Other   Tobacco abuse    Patient reports a 30-year pack history.  Denies shortness of breath with exertion but does endorse wheezing periodically at night.  Patient denies chest pain or wheezing throughout the day. Plan: -Due to 30-year pack history, obtaining PFTs would be ideal however patient does not have insurance -Will consider PFTs in the future if patient becomes insured -Continue to encourage tobacco cessation -Patient denied resources or medications to help with tobacco cessation at this moment       Alcohol abuse    Patient has a history of excessive alcohol use.  When he was hospitalized, he reported drinking up to 8-9 beers a day.  During his hospitalization he did not experience withdrawal symptoms.  He stated that he has decreased his alcohol use to about 12 beers per week and he was sent home from the hospital with folic acid, thiamine, multivitamin. PLAN: -Discontinue folic acid, thiamine, multivitamin -Possible GI follow-up in the future for esophageal varices evaluation if patient is able to get insurance      HLD (hyperlipidemia)    During hospitalization, a lipid panel showed an elevated LDL of 117.  Patient was started on  atorvastatin 80 mg.  Patient reports compliance with this medication.  Patient also reported an active lifestyle and a diet with excessive processed foods and fast foods. Plan -Continue atorvastatin 80 mg -Will hold on lipid profile, patient does not have health insurance at this moment -LDL goal for him would be less than 70  -Patient counseled on the importance of exercise and avoiding processed/fast foods         Return in about 4 weeks (around 08/17/2023) for blood pressure .   Faith Rogue, DO

## 2023-07-20 NOTE — Assessment & Plan Note (Signed)
Patient reports a 30-year pack history.  Denies shortness of breath with exertion but does endorse wheezing periodically at night.  Patient denies chest pain or wheezing throughout the day. Plan: -Due to 30-year pack history, obtaining PFTs would be ideal however patient does not have insurance -Will consider PFTs in the future if patient becomes insured -Continue to encourage tobacco cessation -Patient denied resources or medications to help with tobacco cessation at this moment

## 2023-07-20 NOTE — Assessment & Plan Note (Signed)
Patient has a diagnosis of hypertension that was previously untreated prior to hospitalization.  During the hospitalization he was started on losartan 25 mg.  He reports compliance with medication.  His blood pressure today was 151/79.  Uncontrolled pretension is likely multifactorial from tobacco use, alcohol use, and diet was increased sodium, need for increase in medication. PLAN: -Recheck BMP today -Contingency plan is to increase losartan if serum creatinine is within normal limits -Patient advised to continue decreasing tobacco intake and alcohol intake -Patient was counseled on the importance of diet and exercise and decreasing sodium intake with hypertension

## 2023-07-21 ENCOUNTER — Other Ambulatory Visit: Payer: Self-pay | Admitting: Student

## 2023-07-21 ENCOUNTER — Other Ambulatory Visit (HOSPITAL_COMMUNITY): Payer: Self-pay

## 2023-07-21 MED ORDER — LOSARTAN POTASSIUM 50 MG PO TABS
50.0000 mg | ORAL_TABLET | Freq: Every day | ORAL | 11 refills | Status: DC
  Filled 2023-07-21: qty 30, 30d supply, fill #0

## 2023-07-21 MED ORDER — LOSARTAN POTASSIUM 50 MG PO TABS
50.0000 mg | ORAL_TABLET | Freq: Every day | ORAL | 11 refills | Status: DC
Start: 2023-07-21 — End: 2023-07-21
  Filled 2023-07-21: qty 30, 30d supply, fill #0

## 2023-07-21 NOTE — Progress Notes (Signed)
Internal Medicine Clinic Attending  I was physically present during the key portions of the resident provided service and participated in the medical decision making of patient's management care. I reviewed pertinent patient test results.  The assessment, diagnosis, and plan were formulated together and I agree with the documentation in the resident's note.  Mercie Eon, MD   I agree with plan to increase Losartan to 50mg  daily

## 2023-07-21 NOTE — Progress Notes (Signed)
I spoke with the patient on the phone and notified him that the BMP is stable.  He was notified that we will increase the losartan to 50 mg for better blood pressure control and that will be sent to the [facility] pharmacy.

## 2023-07-27 ENCOUNTER — Other Ambulatory Visit (HOSPITAL_COMMUNITY): Payer: Self-pay

## 2023-08-17 ENCOUNTER — Encounter: Payer: Self-pay | Admitting: Internal Medicine

## 2023-08-27 ENCOUNTER — Other Ambulatory Visit (HOSPITAL_COMMUNITY): Payer: Self-pay

## 2023-08-27 ENCOUNTER — Encounter: Payer: Self-pay | Admitting: Internal Medicine

## 2023-08-27 ENCOUNTER — Other Ambulatory Visit: Payer: Self-pay

## 2023-08-27 ENCOUNTER — Ambulatory Visit (INDEPENDENT_AMBULATORY_CARE_PROVIDER_SITE_OTHER): Payer: Self-pay | Admitting: Internal Medicine

## 2023-08-27 VITALS — BP 164/87 | HR 81 | Temp 98.1°F | Ht 69.0 in | Wt 153.8 lb

## 2023-08-27 DIAGNOSIS — H3411 Central retinal artery occlusion, right eye: Secondary | ICD-10-CM

## 2023-08-27 DIAGNOSIS — E785 Hyperlipidemia, unspecified: Secondary | ICD-10-CM

## 2023-08-27 DIAGNOSIS — Z720 Tobacco use: Secondary | ICD-10-CM

## 2023-08-27 DIAGNOSIS — I10 Essential (primary) hypertension: Secondary | ICD-10-CM

## 2023-08-27 DIAGNOSIS — F17210 Nicotine dependence, cigarettes, uncomplicated: Secondary | ICD-10-CM

## 2023-08-27 MED ORDER — ATORVASTATIN CALCIUM 80 MG PO TABS
80.0000 mg | ORAL_TABLET | Freq: Every day | ORAL | 0 refills | Status: DC
Start: 2023-08-27 — End: 2023-11-12
  Filled 2023-08-27 (×2): qty 30, 30d supply, fill #0

## 2023-08-27 MED ORDER — LOSARTAN POTASSIUM-HCTZ 50-12.5 MG PO TABS
1.0000 | ORAL_TABLET | Freq: Every day | ORAL | 5 refills | Status: DC
Start: 2023-08-27 — End: 2024-07-18
  Filled 2023-08-27: qty 60, 60d supply, fill #0
  Filled 2023-11-12: qty 60, 60d supply, fill #1

## 2023-08-27 NOTE — Assessment & Plan Note (Signed)
BP today is 170/81, recheck is similar. Patient reports he has not been taking his losartan for about the last week because he ran out. Discussed adding a new antihypertensive agent for better BP control -Starting losartan-hydrochlorothiazide 50-12.5 -Follow up in 1 month for BP recheck

## 2023-08-27 NOTE — Assessment & Plan Note (Signed)
Patient has cut back to 6-7 cigarettes per day. Not interested in Chantix at this time.

## 2023-08-27 NOTE — Patient Instructions (Signed)
Blake Hughes,   It was a pleasure to meet you.   For your hypertension, I am prescribing a new medication called Hyzar. You will take this every day instead of the losartan.   Please continue taking all other medications, and let us know if you need refills of anything. We will plan seeing you again in another month to check blood pressure.   Thanks,  Dr Carlynn Purl

## 2023-08-27 NOTE — Progress Notes (Signed)
    Subjective:  CC: BP recheck  HPI:  Mr.Blake Hughes is a 57 y.o. male with a past medical history stated below and presents today for above. Please see problem based assessment and plan for additional details.  Past Medical History:  Diagnosis Date   Central retinal artery occlusion of right eye    Coronary artery disease    HTN (hypertension)     Current Outpatient Medications on File Prior to Visit  Medication Sig Dispense Refill   aspirin EC 81 MG tablet Take 1 tablet (81 mg total) by mouth daily. Swallow whole. 120 tablet 0   clopidogrel (PLAVIX) 75 MG tablet Take 1 tablet (75 mg total) by mouth daily. 19 tablet 0   nitroGLYCERIN (NITROSTAT) 0.4 MG SL tablet Place 1 tablet (0.4 mg total) under the tongue every 5 (five) minutes as needed for chest pain (For up to three doses in 24 hours). 25 tablet 0   No current facility-administered medications on file prior to visit.    Review of Systems: ROS negative except for as is noted on the assessment and plan.  Objective:   Vitals:   08/27/23 0927 08/27/23 1008  BP: (!) 170/81 (!) 164/87  Pulse: 92 81  Temp: 98.1 F (36.7 C)   TempSrc: Oral   SpO2: 99%   Weight: 153 lb 12.8 oz (69.8 kg)   Height: 5\' 9"  (1.753 m)     Physical Exam: Constitutional: well-appearing, in no acute distress HENT: complete vision loss in right eye Eyes: conjunctiva non-erythematous Neck: supple Cardiovascular: regular rate and rhythm, no m/r/g Pulmonary/Chest: normal work of breathing on room air, lungs clear to auscultation bilaterally Abdominal: soft, non-tender, non-distended MSK: normal bulk and tone Neurological: alert & oriented x 3, 5/5 strength in bilateral upper and lower extremities, normal gait Skin: warm and dry  Assessment & Plan:   HTN (hypertension) BP today is 170/81, recheck is similar. Patient reports he has not been taking his losartan for about the last week because he ran out. Discussed adding a new  antihypertensive agent for better BP control -Starting losartan-hydrochlorothiazide 50-12.5 -Follow up in 1 month for BP recheck  HLD (hyperlipidemia) Patient has been taking atorvastatin 80mg  daily. Last LDL in July was 117. -Check lipid profile today  Central retinal artery occlusion of right eye Patient follows with ophthalmology for management of CRAO  Tobacco abuse Patient has cut back to 6-7 cigarettes per day. Not interested in Chantix at this time.    Patient seen with Dr. Victorino December MD [facility]  PGY-1 Pager: [phone] Date 08/27/2023  Time 10:24 AM

## 2023-08-27 NOTE — Assessment & Plan Note (Signed)
Patient follows with ophthalmology for management of CRAO

## 2023-08-27 NOTE — Assessment & Plan Note (Addendum)
Patient has been taking atorvastatin 80mg  daily. Last LDL in July was 117. -Check lipid profile today

## 2023-08-29 LAB — LIPID PANEL
Chol/HDL Ratio: 3.9 ratio (ref 0.0–5.0)
Cholesterol, Total: 221 mg/dL — ABNORMAL HIGH (ref 100–199)
HDL: 57 mg/dL (ref >39)
LDL Chol Calc (NIH): 145 mg/dL — ABNORMAL HIGH (ref 0–99)
Triglycerides: 108 mg/dL (ref 0–149)
VLDL Cholesterol Cal: 19 mg/dL (ref 5–40)

## 2023-08-30 ENCOUNTER — Other Ambulatory Visit: Payer: Self-pay

## 2023-08-30 NOTE — Progress Notes (Signed)
Internal Medicine Clinic Attending  I was physically present during the key portions of the resident provided service and participated in the medical decision making of patient's management care. I reviewed pertinent patient test results.  The assessment, diagnosis, and plan were formulated together and I agree with the documentation in the resident's note.  Erlinda Hong, MD FACP

## 2023-08-31 ENCOUNTER — Other Ambulatory Visit (HOSPITAL_BASED_OUTPATIENT_CLINIC_OR_DEPARTMENT_OTHER): Payer: Self-pay

## 2023-09-23 NOTE — Progress Notes (Deleted)
   CC: Follow-up for blood pressure recheck.  HPI:  Mr.Blake Hughes is a 57 y.o. male living with a history stated below and presents today for follow-up for blood pressure recheck.. Please see problem based assessment and plan for additional details.  Past Medical History:  Diagnosis Date   Central retinal artery occlusion of right eye    Coronary artery disease    HTN (hypertension)     Current Outpatient Medications on File Prior to Visit  Medication Sig Dispense Refill   aspirin EC 81 MG tablet Take 1 tablet (81 mg total) by mouth daily. Swallow whole. 120 tablet 0   atorvastatin (LIPITOR) 80 MG tablet Take 1 tablet (80 mg total) by mouth daily. 30 tablet 0   clopidogrel (PLAVIX) 75 MG tablet Take 1 tablet (75 mg total) by mouth daily. 19 tablet 0   losartan-hydrochlorothiazide (HYZAAR) 50-12.5 MG tablet Take 1 tablet by mouth daily. 60 tablet 5   nitroGLYCERIN (NITROSTAT) 0.4 MG SL tablet Place 1 tablet (0.4 mg total) under the tongue every 5 (five) minutes as needed for chest pain (For up to three doses in 24 hours). 25 tablet 0   No current facility-administered medications on file prior to visit.     Review of Systems: ROS negative except for what is noted on the assessment and plan.  There were no vitals filed for this visit.  Physical Exam: Constitutional: Well appearing, not in acute distress. Cardiovascular: regular rate and rhythm, no m/r/g Pulmonary/Chest: normal work of breathing on room air, lungs clear to auscultation bilaterally Abdominal: soft, non-tender, non-distended  Assessment & Plan:   Hypertension BP today is ****, recheck is similar.  She is on losartan-HCTZ 50-12.5.  She endorses compliance.  -BMP today.  HLD (hyperlipidemia) ***Ask patient if they taking atorvastatin 80 mg. Most recent LDL 145.  Patient has been taking atorvastatin 80mg  daily, unclear why her LDL keeps increasing.  - Check lipid profile today.    Central retinal  artery occlusion of right eye Patient follows with ophthalmology for management of CRAO   Tobacco abuse Patient has cut back to 6-7 cigarettes per day. Not interested in Chantix at this time.    Healthcare maintenance Colonoscopy never done Hepatitis C screening Flu vaccine.  Patient {GC/[id number]::"discussed with","seen with"} Dr. {[id number]::"[id number]","G. Hoffman","Mullen","Narendra","Vincent","Guilloud","Lau","Machen"}  No problem-specific Assessment & Plan notes found for this encounter.   Laretta Bolster, MD [facility] Internal Medicine, PGY-1 Phone: [phone] Date 09/23/2023 Time 1:33 PM

## 2023-09-24 ENCOUNTER — Encounter: Payer: Self-pay | Admitting: Student

## 2023-11-08 ENCOUNTER — Other Ambulatory Visit (HOSPITAL_COMMUNITY): Payer: Self-pay

## 2023-11-08 MED ORDER — BRIMONIDINE TARTRATE 0.2 % OP SOLN
1.0000 [drp] | Freq: Two times a day (BID) | OPHTHALMIC | 3 refills | Status: AC
  Filled 2023-11-08: qty 5, 50d supply, fill #0
  Filled 2023-11-12: qty 5, 50d supply, fill #1
  Filled 2023-11-19: qty 5, 50d supply, fill #2

## 2023-11-08 MED ORDER — ACETAZOLAMIDE ER 500 MG PO CP12
500.0000 mg | ORAL_CAPSULE | Freq: Two times a day (BID) | ORAL | 1 refills | Status: AC
  Filled 2023-11-08: qty 10, 5d supply, fill #0
  Filled 2023-11-12: qty 10, 5d supply, fill #1

## 2023-11-08 MED ORDER — DORZOLAMIDE HCL-TIMOLOL MAL 2-0.5 % OP SOLN
1.0000 [drp] | Freq: Two times a day (BID) | OPHTHALMIC | 3 refills | Status: AC
  Filled 2023-11-08: qty 10, 100d supply, fill #0
  Filled 2023-11-12: qty 10, 100d supply, fill #1
  Filled 2023-11-19: qty 10, 100d supply, fill #2

## 2023-11-12 ENCOUNTER — Other Ambulatory Visit (HOSPITAL_COMMUNITY): Payer: Self-pay

## 2023-11-12 ENCOUNTER — Other Ambulatory Visit: Payer: Self-pay | Admitting: Student

## 2023-11-12 MED ORDER — NITROGLYCERIN 0.4 MG SL SUBL
0.4000 mg | SUBLINGUAL_TABLET | SUBLINGUAL | 0 refills | Status: AC | PRN
  Filled 2023-11-12: qty 25, 10d supply, fill #0

## 2023-11-12 MED ORDER — ASPIRIN 81 MG PO TBEC
81.0000 mg | DELAYED_RELEASE_TABLET | Freq: Every day | ORAL | 0 refills | Status: AC
  Filled 2023-11-12: qty 120, 120d supply, fill #0

## 2023-11-12 MED ORDER — ATORVASTATIN CALCIUM 80 MG PO TABS
80.0000 mg | ORAL_TABLET | Freq: Every day | ORAL | 0 refills | Status: DC
  Filled 2023-11-12: qty 30, 30d supply, fill #0

## 2023-11-19 ENCOUNTER — Other Ambulatory Visit (HOSPITAL_COMMUNITY): Payer: Self-pay

## 2024-02-16 ENCOUNTER — Other Ambulatory Visit (HOSPITAL_COMMUNITY): Payer: Self-pay

## 2024-02-16 MED ORDER — BRIMONIDINE TARTRATE 0.2 % OP SOLN
1.0000 [drp] | Freq: Two times a day (BID) | OPHTHALMIC | 2 refills | Status: AC
  Filled 2024-02-16: qty 5, 50d supply, fill #0

## 2024-02-16 MED ORDER — DORZOLAMIDE HCL-TIMOLOL MAL 2-0.5 % OP SOLN
1.0000 [drp] | Freq: Two times a day (BID) | OPHTHALMIC | 2 refills | Status: AC
  Filled 2024-02-16: qty 10, 100d supply, fill #0

## 2024-02-16 MED ORDER — LATANOPROST 0.005 % OP SOLN
1.0000 [drp] | Freq: Every day | OPHTHALMIC | 2 refills | Status: AC
  Filled 2024-02-16: qty 2.5, 42d supply, fill #0

## 2024-02-16 MED ORDER — ACETAZOLAMIDE 250 MG PO TABS
250.0000 mg | ORAL_TABLET | Freq: Four times a day (QID) | ORAL | 0 refills | Status: AC
Start: 2024-02-16 — End: ?
  Filled 2024-02-16: qty 30, 8d supply, fill #0

## 2024-07-17 ENCOUNTER — Ambulatory Visit: Payer: Self-pay

## 2024-07-17 NOTE — Telephone Encounter (Addendum)
 Return call to pt to f/u on pt's call- no answer. Pt has an appt tomorrow 8/12 @ 0845Am w/Dr Celestina. I left a message on vm of our new location and to go to [facility] if vision/pain become worse.

## 2024-07-17 NOTE — Telephone Encounter (Signed)
 FYI Only or Action Required?: FYI only for provider.  Patient was last seen in primary care on 08/27/2023 by Francella Rogue, MD.  Called Nurse Triage reporting Eye Pain.  Symptoms began several months ago.  Interventions attempted: Nothing.  Symptoms are: gradually worsening.  Triage Disposition: See Physician Within 24 Hours  Patient/caregiver understands and will follow disposition?: Yes     Copied from CRM #[id number]. 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 He will be safe pay as well until he gets medicaid. Looking for a pre op in future to get eye removed Reason for Disposition  Eye pain present > 24 hours  Answer Assessment - Initial Assessment Questions 1. ONSET: When did the pain start? (e.g., minutes, hours, days)     Stroke last year, legally blind 2. TIMING: Does the pain come and go, or has it been constant since it started? (e.g., constant, intermittent, fleeting)     Over a year ago 3. SEVERITY: How bad is the pain?  (Scale 1-10; mild, moderate or severe)     severe 4. LOCATION: Where does it hurt?  (e.g., eyelid, eye, cheekbone)     Right eye 5. CAUSE: What do you think is causing the pain?     Pain, stroke last year 6. VISION: Do you have blurred vision or changes in your vision?      Blind since stroke 7. EYE DISCHARGE: Is there any discharge (pus) from the eye(s)?  If Yes, ask: What color is it?      Yes, clear 8. FEVER: Do you have a fever? If Yes, ask: What is it, how was it measured, and when did it start?      no 9. OTHER SYMPTOMS: Do you have any other symptoms? (e.g., headache, nasal discharge, facial rash)      no 10. PREGNANCY: Is there any chance you are pregnant? When was your last menstrual period?       na  Protocols used: Eye Pain and Other Symptoms-A-AH

## 2024-07-18 ENCOUNTER — Other Ambulatory Visit: Payer: Self-pay

## 2024-07-18 ENCOUNTER — Encounter: Payer: Self-pay | Admitting: Student

## 2024-07-18 ENCOUNTER — Ambulatory Visit (INDEPENDENT_AMBULATORY_CARE_PROVIDER_SITE_OTHER): Payer: Self-pay | Admitting: Student

## 2024-07-18 VITALS — BP 182/89 | HR 87 | Temp 98.1°F | Resp 20 | Ht 69.0 in | Wt 141.4 lb

## 2024-07-18 DIAGNOSIS — F17210 Nicotine dependence, cigarettes, uncomplicated: Secondary | ICD-10-CM

## 2024-07-18 DIAGNOSIS — I249 Acute ischemic heart disease, unspecified: Secondary | ICD-10-CM | POA: Insufficient documentation

## 2024-07-18 DIAGNOSIS — Z720 Tobacco use: Secondary | ICD-10-CM

## 2024-07-18 DIAGNOSIS — I10 Essential (primary) hypertension: Secondary | ICD-10-CM

## 2024-07-18 DIAGNOSIS — E785 Hyperlipidemia, unspecified: Secondary | ICD-10-CM

## 2024-07-18 DIAGNOSIS — H3411 Central retinal artery occlusion, right eye: Secondary | ICD-10-CM

## 2024-07-18 MED ORDER — LOSARTAN POTASSIUM-HCTZ 50-12.5 MG PO TABS
1.0000 | ORAL_TABLET | Freq: Every day | ORAL | 11 refills | Status: AC
  Filled 2024-07-18: qty 60, 60d supply, fill #0

## 2024-07-18 MED ORDER — ATORVASTATIN CALCIUM 80 MG PO TABS
80.0000 mg | ORAL_TABLET | Freq: Every day | ORAL | 11 refills | Status: AC
  Filled 2024-07-18: qty 30, 30d supply, fill #0
  Filled 2024-08-22: qty 30, 30d supply, fill #1

## 2024-07-18 NOTE — Assessment & Plan Note (Addendum)
 Current smoker with about 40-pack-year history.  Interested in trying medication to decrease cigarette use, would like to address this at the next office visit. - Will set a date, when he will stop smoking.

## 2024-07-18 NOTE — Assessment & Plan Note (Addendum)
 ChatGPT said: Follows with ophthalmology and presents for preoperative clearance for evisceration of the right eye under general anesthesia, with possible implant placement. METS > 4; revised cardiac risk index is 2, indicating a 5% risk of major cardiac event. Given uncontrolled hypertension, elective surgery will be deferred until blood pressure is better controlled.  - Follow-up in 2 weeks, and consider filling clearance form if blood pressure is better controlled.

## 2024-07-18 NOTE — Progress Notes (Signed)
 CC: Need surgical clearance for eye removal.  HPI: Mr. Blake Hughes. Blake Hughes is a 58 year old male with the history noted below, presenting today for follow-up on hypertension and for surgical clearance for eye removal.   He was last seen in the resident [facility] in December 2024 for uncontrolled HTN.  Please see problem based assessment and plan for additional details.  Past Medical History:  Diagnosis Date   Central retinal artery occlusion of right eye    Coronary artery disease    HTN (hypertension)     Current Outpatient Medications on File Prior to Visit  Medication Sig Dispense Refill   acetaZOLAMIDE  (DIAMOX ) 250 MG tablet Take 1 tablet (250 mg total) by mouth 4 (four) times daily. 30 tablet 0   acetaZOLAMIDE  ER (DIAMOX ) 500 MG capsule Take 1 capsule (500 mg total) by mouth 2 (two) times daily. 10 capsule 1   aspirin  EC 81 MG tablet Take 1 tablet (81 mg total) by mouth daily. Swallow whole. 120 tablet 0   brimonidine  (ALPHAGAN ) 0.2 % ophthalmic solution Apply one drop into right eye 2 times a day. 5 mL 3   brimonidine  (ALPHAGAN ) 0.2 % ophthalmic solution Place 1 drop into the right eye 2 (two) times daily. 5 mL 2   clopidogrel  (PLAVIX ) 75 MG tablet Take 1 tablet (75 mg total) by mouth daily. 19 tablet 0   dorzolamide -timolol  (COSOPT ) 2-0.5 % ophthalmic solution Apply one drop into right eye 2 times a day. 10 mL 3   dorzolamide -timolol  (COSOPT ) 2-0.5 % ophthalmic solution Place 1 drop into the right eye 2 (two) times daily. 10 mL 2   latanoprost  (XALATAN ) 0.005 % ophthalmic solution Place 1 drop into the right eye at bedtime. 2.5 mL 2   nitroGLYCERIN  (NITROSTAT ) 0.4 MG SL tablet Place 1 tablet (0.4 mg total) under the tongue every 5 (five) minutes as needed for chest pain (For up to three doses in 24 hours). 25 tablet 0   No current facility-administered medications on file prior to visit.    Review of Systems: ROS negative except for what is noted on the assessment and  plan.  Vitals:   07/18/24 0853 07/18/24 0903  BP: (!) 181/88 (!) 182/89  Pulse: 85 87  Resp: 20   Temp: 98.1 F (36.7 C)   TempSrc: Oral   SpO2: 100%   Weight: 141 lb 6.4 oz (64.1 kg)   Height: 5' 9 (1.753 m)       Physical Exam: Constitutional: Not in acute distress.  Legally blind in the right eye. Cardiovascular: RRR, no murmurs. Pulmonary/Chest: Clear bilateral lungs Extremities: Skin warm and dry.  No edema.  Assessment & Plan:   Patient discussed with Dr. Shawn  Assessment & Plan Primary hypertension ACS (acute coronary syndrome) [facility]) History of ACS with placement of stents in LAD in 2013.  Echo with bubble study 2024 showed LV with normal function, and grade 1 diastolic dysfunction.  No valvular abnormalities noted.He does not follow with cardiology, takes aspirin  daily.    Uncontrolled hypertension due to medication nonadherence.  He has been without blood pressure meds for about 8 months due to not having health insurance.  OP regimen includes losartan -hydrochlorothiazide  50-12.5.  Will send meds to medications to [facility] where these medicines can be filled for free. - BMP check - will check A1c. - Follow-up in 2 weeks - Referral to cardiology placed. - Please follow-up financial assistance form. -Continue aspirin  daily. -Continue losartan -HCTZ 50-12.5 mg daily. Central retinal artery  occlusion of right eye ChatGPT said: Follows with ophthalmology and presents for preoperative clearance for evisceration of the right eye under general anesthesia, with possible implant placement. METS > 4; revised cardiac risk index is 2, indicating a 5% risk of major cardiac event. Given uncontrolled hypertension, elective surgery will be deferred until blood pressure is better controlled.  - Follow-up in 2 weeks, and consider filling clearance form if blood pressure is better controlled.  Tobacco abuse Current smoker with about 40-pack-year history.   Interested in trying medication to decrease cigarette use, would like to address this at the next office visit. - Will set a date, when he will stop smoking. Hyperlipidemia, unspecified hyperlipidemia type Last LDL above goal, presently taking atorvastatin  80 mg, nonadherent. - Will restart atorvastatin  80 mg.   Orders Placed This Encounter  Procedures   BMP8   Hemoglobin A1c   Ambulatory referral to Cardiology    Missy Sandhoff, MD [facility] Internal Medicine, PGY-2  Date 07/18/2024 Time 11:06 AM

## 2024-07-18 NOTE — Progress Notes (Signed)
 Internal Medicine Clinic Attending  Case discussed with the resident at the time of the visit.  We reviewed the resident's history and exam and pertinent patient test results.  I agree with the assessment, diagnosis, and plan of care documented in the resident's note.

## 2024-07-18 NOTE — Assessment & Plan Note (Signed)
 History of ACS with placement of stents in LAD in 2013.  Echo with bubble study 2024 showed LV with normal function, and grade 1 diastolic dysfunction.  No valvular abnormalities noted.He does not follow with cardiology, takes aspirin  daily.    Uncontrolled hypertension due to medication nonadherence.  He has been without blood pressure meds for about 8 months due to not having health insurance.  OP regimen includes losartan -hydrochlorothiazide  50-12.5.  Will send meds to medications to [facility] where these medicines can be filled for free. - BMP check - will check A1c. - Follow-up in 2 weeks - Referral to cardiology placed. - Please follow-up financial assistance form. -Continue aspirin  daily. -Continue losartan -HCTZ 50-12.5 mg daily.

## 2024-07-18 NOTE — Patient Instructions (Addendum)
 It was a pleasure taking care of you today!    1.  Your blood pressure is uncontrolled, I have sent refills of your medications to the pharmacy.  I have placed a referral for you to be seen by a cardiologist.  2.  As we discussed, we will hold off on the surgery for now, as your blood pressure is not well-controlled.  I would like to see you back in 2 weeks.  3.  Please fill the financial assistance form, this will allow you to afford imaging/lab done within the [facility] system.   I have ordered the following labs for you:  Lab Orders  No laboratory test(s) ordered today      Follow up: 2 weeks   Should you have any questions or concerns please call the internal medicine clinic at [phone].     Missy Sandhoff, MD  [facility]

## 2024-07-18 NOTE — Assessment & Plan Note (Addendum)
 History of ACS with placement of stents in LAD in 2013.  Echo with bubble study 2024 showed LV with normal function, and grade 1 diastolic dysfunction.  No valvular abnormalities noted.He does not follow with cardiology, takes aspirin  daily.    Uncontrolled hypertension due to medication nonadherence.  He has been without blood pressure meds for about 8 months due to not having health insurance.  OP regimen includes losartan -hydrochlorothiazide  50-12.5.  Will send meds to medications to [facility] where these medicines can be filled for free. - BMP check - will check A1c. - Follow-up in 2 weeks - Referral to cardiology placed. - Please follow-up financial assistance form. -Continue aspirin  daily. -Continue losartan -HCTZ 50-12.5 mg daily.

## 2024-07-18 NOTE — Assessment & Plan Note (Addendum)
 Last LDL above goal, presently taking atorvastatin  80 mg, nonadherent. - Will restart atorvastatin  80 mg.

## 2024-07-19 ENCOUNTER — Ambulatory Visit: Payer: Self-pay | Admitting: Student

## 2024-07-19 LAB — BMP8
BUN: 10 mg/dL (ref 8–27)
CO2: 23 mmol/L (ref 20–29)
Calcium: 9.5 mg/dL (ref 8.6–10.2)
Chloride: 103 mmol/L (ref 96–106)
Creatinine, Ser: 0.8 mg/dL (ref 0.76–1.27)
Glucose: 108 mg/dL — ABNORMAL HIGH (ref 70–99)
Potassium: 4.9 mmol/L (ref 3.5–5.2)
Sodium: 141 mmol/L (ref 134–144)
eGFR: 101 mL/min/1.73 (ref >59)

## 2024-07-19 LAB — HEMOGLOBIN A1C
Est. average glucose Bld gHb Est-mCnc: 103 mg/dL
Hgb A1c MFr Bld: 5.2 % (ref 4.8–5.6)

## 2024-07-19 NOTE — Progress Notes (Signed)
 BMP normal.

## 2024-08-01 ENCOUNTER — Ambulatory Visit (INDEPENDENT_AMBULATORY_CARE_PROVIDER_SITE_OTHER): Payer: Self-pay | Admitting: Student

## 2024-08-01 ENCOUNTER — Encounter: Payer: Self-pay | Admitting: Student

## 2024-08-01 ENCOUNTER — Other Ambulatory Visit: Payer: Self-pay

## 2024-08-01 VITALS — BP 139/74 | HR 73 | Temp 97.8°F | Ht 69.0 in | Wt 138.6 lb

## 2024-08-01 DIAGNOSIS — H5711 Ocular pain, right eye: Secondary | ICD-10-CM

## 2024-08-01 DIAGNOSIS — I10 Essential (primary) hypertension: Secondary | ICD-10-CM

## 2024-08-01 DIAGNOSIS — H3411 Central retinal artery occlusion, right eye: Secondary | ICD-10-CM

## 2024-08-01 DIAGNOSIS — H5440 Blindness, one eye, unspecified eye: Secondary | ICD-10-CM

## 2024-08-01 NOTE — Progress Notes (Signed)
 Established Patient Office Visit  Subjective   Patient ID: Blake Hughes, male    DOB: [birth year]  Age: 58 y.o. MRN: [id number]  Chief Complaint  Patient presents with   Follow-up    2 wk    Hypertension   Eye Pain    Pt reports that his ( RIGHT )  eye has been hurting him and getting worse .SABRA Pt reported  pain  has  been on going since last year  and has been under the care of a dr ETTER schultz )  for it and getting shots in the eye   at another dr  ... When asked have he called  and told them that  his eye is hurting him again he stated  that  was unable to  get hold of the dr  or any one to call him back     Blake Hughes is a 58 y.o. who presents to the clinic for HTN and to discuss medical clearance for an operation to address his blind painful right eye and neovascular glaucoma of the right eye. Please see problem based assessment and plan for additional details.   Patient Active Problem List   Diagnosis Date Noted   Blind painful right eye 08/01/2024   ACS (acute coronary syndrome) (HCC) 07/18/2024   Central retinal artery occlusion of right eye 07/20/2023   HLD (hyperlipidemia) 07/20/2023   CAD (coronary artery disease) 07/01/2023   HTN (hypertension) 07/01/2023   Acute loss of vision, right 06/30/2023   Tobacco abuse 07/03/2018   Alcohol abuse 07/03/2018      Objective:     BP 139/74 (BP Location: Left Arm, Patient Position: Sitting, Cuff Size: Large)   Pulse 73   Temp 97.8 F (36.6 C) (Oral)   Ht 5' 9 (1.753 m)   Wt 138 lb 9.6 oz (62.9 kg)   SpO2 100%   BMI 20.47 kg/m  BP Readings from Last 3 Encounters:  08/01/24 139/74  07/18/24 (!) 182/89  08/27/23 (!) 164/87   Wt Readings from Last 3 Encounters:  08/01/24 138 lb 9.6 oz (62.9 kg)  07/18/24 141 lb 6.4 oz (64.1 kg)  08/27/23 153 lb 12.8 oz (69.8 kg)     Physical Exam Vitals reviewed.  Constitutional:      General: He is not in acute distress.    Appearance: He is normal weight. He is  not ill-appearing, toxic-appearing or diaphoretic.  Eyes:     General:        Right eye: Discharge present.     Comments: Right eye has watery discharge with red conjunctiva   Cardiovascular:     Rate and Rhythm: Normal rate and regular rhythm.     Heart sounds: No murmur heard. Pulmonary:     Effort: Pulmonary effort is normal. No respiratory distress.     Breath sounds: Normal breath sounds.  Skin:    General: Skin is warm and dry.  Neurological:     Mental Status: He is alert.  Psychiatric:        Mood and Affect: Mood and affect normal.        Behavior: Behavior normal. Behavior is cooperative.      No results found for any visits on 08/01/24.  Last metabolic panel Lab Results  Component Value Date   GLUCOSE 108 (H) 07/18/2024   NA 141 07/18/2024   K 4.9 07/18/2024   CL 103 07/18/2024   CO2 23 07/18/2024   BUN 10  07/18/2024   CREATININE 0.80 07/18/2024   EGFR 101 07/18/2024   CALCIUM  9.5 07/18/2024   PROT 6.3 (L) 07/01/2023   ALBUMIN 3.4 (L) 07/01/2023   BILITOT 1.0 07/01/2023   ALKPHOS 58 07/01/2023   AST 28 07/01/2023   ALT 36 07/01/2023   ANIONGAP 9 07/01/2023      The 10-year ASCVD risk score (Arnett DK, et al., 2019) is: 18.1%    Assessment & Plan:   Problem List Items Addressed This Visit       Cardiovascular and Mediastinum   HTN (hypertension) - Primary   Patient presents with a history of hypertension with a blood pressure today of 139/74.  When he was evaluated 2 weeks prior, he had uncontrolled hypertension due to medication noncompliance and was restarted back on his home regimen.  He is on an outpatient regimen of losartan  hydrochlorothiazide  50-12.5 mg daily.  Prior BMP 2 weeks prior was WNL.   Plan: -Continue current regimen of: losartan -hydrochlorothiazide  50-12.5 mg  -BMP today       Relevant Orders   Basic metabolic panel with GFR   Central retinal artery occlusion of right eye     Other   Blind painful right eye   Patient and  significant other present to discuss clearance for evisceration of the right eye for treatment of Blind painful right eye (Primary Dx) and Neovascular glaucoma of right eye, severe stage.  Her revised cardiac risk index, patient is at a 5% increased risk and adverse cardiac event within 30 days of operation due to prior ACS in 2013 with a stent placement and central retinal artery occlusion of the right eye in 2024.  Patient is aware and understands the risk and is proceeding forward with operation.  Plan: - BMP ordered for today -Recommend continuing aspirin  81 mg, patient will need to check with ophthalmologist for final recommendation -Patient was asked to notify ophthalmologist that he has not been on Plavix  since last August/ September.       Return in about 3 months (around 11/01/2024).    Damien Lease, DO

## 2024-08-01 NOTE — Assessment & Plan Note (Signed)
 Patient presents with a history of hypertension with a blood pressure today of 139/74.  When he was evaluated 2 weeks prior, he had uncontrolled hypertension due to medication noncompliance and was restarted back on his home regimen.  He is on an outpatient regimen of losartan  hydrochlorothiazide  50-12.5 mg daily.  Prior BMP 2 weeks prior was WNL.   Plan: -Continue current regimen of: losartan -hydrochlorothiazide  50-12.5 mg  -BMP today

## 2024-08-01 NOTE — Assessment & Plan Note (Deleted)
 Patient and significant other present to discuss clearance for evisceration of the right eye for treatment of Blind painful right eye (Primary Dx) and Neovascular glaucoma of right eye, severe stage.  Her revised cardiac risk index, patient is at a 5% increased risk and adverse cardiac event within 30 days of operation due to prior ACS in 2013 with a stent placement and central retinal artery occlusion of the right eye in 2024.  Patient is aware and understands the risk and is proceeding forward with operation.  Plan: - BMP ordered for today -Recommend continuing aspirin  81 mg, patient will need to check with ophthalmologist for final recommendation -Patient was asked to notify ophthalmologist that he has not been on Plavix  since last August/ September.

## 2024-08-01 NOTE — Patient Instructions (Signed)
 Thank you, Mr.Blake Hughes for allowing us  to provide your care today. Today we discussed operative clearance for your surgery. You have a 5% risk of an adverse cardiac event following your surgery (heart attack). Please have your eye doctor fax us  forms to complete for your surgery. If you are able to continue the baby aspirin , please do so! .  I have ordered the following labs for you:   Lab Orders         Basic metabolic panel with GFR        Referrals ordered today:   Referral Orders  No referral(s) requested today     I have ordered the following medication/changed the following medications:   Stop the following medications: Medications Discontinued During This Encounter  Medication Reason   clopidogrel  (PLAVIX ) 75 MG tablet      Start the following medications: No orders of the defined types were placed in this encounter.    Follow up: 3 months    Should you have any questions or concerns please call the internal medicine clinic at [phone].     Please note that our late policy has changed.  If you are more than 15 minutes late to your appointment, you may be asked to reschedule your appointment.  Dr. Kandis, D.O. [facility]

## 2024-08-01 NOTE — Assessment & Plan Note (Signed)
 Patient and significant other present to discuss clearance for evisceration of the right eye for treatment of Blind painful right eye (Primary Dx) and Neovascular glaucoma of right eye, severe stage.  Her revised cardiac risk index, patient is at a 5% increased risk and adverse cardiac event within 30 days of operation due to prior ACS in 2013 with a stent placement and central retinal artery occlusion of the right eye in 2024.  Patient is aware and understands the risk and is proceeding forward with operation.  Plan: - BMP ordered for today -Recommend continuing aspirin  81 mg, patient will need to check with ophthalmologist for final recommendation -Patient was asked to notify ophthalmologist that he has not been on Plavix  since last August/ September.

## 2024-08-02 ENCOUNTER — Ambulatory Visit: Payer: Self-pay | Admitting: Student

## 2024-08-02 LAB — BASIC METABOLIC PANEL WITH GFR
BUN/Creatinine Ratio: 11 (ref 10–24)
BUN: 9 mg/dL (ref 8–27)
CO2: 24 mmol/L (ref 20–29)
Calcium: 9.1 mg/dL (ref 8.6–10.2)
Chloride: 103 mmol/L (ref 96–106)
Creatinine, Ser: 0.8 mg/dL (ref 0.76–1.27)
Glucose: 101 mg/dL — ABNORMAL HIGH (ref 70–99)
Potassium: 4.5 mmol/L (ref 3.5–5.2)
Sodium: 141 mmol/L (ref 134–144)
eGFR: 101 mL/min/1.73 (ref >59)

## 2024-08-03 NOTE — Progress Notes (Signed)
 Internal Medicine Clinic Attending  Case discussed with the resident at the time of the visit.  We reviewed the resident's history and exam and pertinent patient test results.  I agree with the assessment, diagnosis, and plan of care documented in the resident's note.

## 2024-08-22 ENCOUNTER — Other Ambulatory Visit: Payer: Self-pay

## 2024-09-14 ENCOUNTER — Ambulatory Visit (INDEPENDENT_AMBULATORY_CARE_PROVIDER_SITE_OTHER)

## 2024-09-14 ENCOUNTER — Ambulatory Visit
Admission: RE | Admit: 2024-09-14 | Discharge: 2024-09-14 | Disposition: A | Discharge status: Home / Self Care | Payer: Self-pay | Source: Ambulatory Visit | Attending: Family Medicine | Admitting: Family Medicine

## 2024-09-14 ENCOUNTER — Other Ambulatory Visit: Payer: Self-pay

## 2024-09-14 VITALS — BP 155/97 | HR 95 | Temp 97.9°F | Resp 18

## 2024-09-14 DIAGNOSIS — Z0189 Encounter for other specified special examinations: Secondary | ICD-10-CM | POA: Diagnosis not present

## 2024-09-14 LAB — POC SOFIA SARS ANTIGEN FIA: SARS Coronavirus 2 Ag: NEGATIVE

## 2024-09-14 NOTE — ED Provider Notes (Signed)
 [facility]   [id number] 09/14/24 Arrival Time: 1236  ASSESSMENT & PLAN:  1. Patient request for diagnostic testing    Copy of x-ray report and negative COVID testing given. Labs should be back tomorrow.   Follow-up Information     Kandis Perkins, DO.   Specialty: Internal Medicine Why: As needed. Contact information: [address] [place] [address] [phone]                 Reviewed expectations re: course of current medical issues. Questions answered. Outlined signs and symptoms indicating need for more acute intervention. Understanding verbalized. After Visit Summary given.   SUBJECTIVE: History from: Patient. Blake Hughes is a 58 y.o. male. Pt here requesting pre-surgical labwork and chest xray. Also reports he has had a cough. He does not want any medications that may affect his upcoming surgery to extract his right eye.   OBJECTIVE:  Vitals:   09/14/24 1259  BP: (!) 155/97  Pulse: 95  Resp: 18  Temp: 97.9 F (36.6 C)  TempSrc: Oral  SpO2: 97%    General appearance: alert; no distress Psychological: alert and cooperative; normal mood and affect  Labs: Results for orders placed or performed during the hospital encounter of 09/14/24  POC SARS Coronavirus 2 Ag   Collection Time: 09/14/24  2:22 PM  Result Value Ref Range   SARS Coronavirus 2 Ag Negative Negative   Labs Reviewed  COMPREHENSIVE METABOLIC PANEL WITH GFR  CBC WITH DIFFERENTIAL/PLATELET  POC SOFIA SARS ANTIGEN FIA    Imaging: DG Chest 2 View Result Date: 09/14/2024 EXAM: 2 VIEW(S) XRAY OF THE CHEST 09/14/2024 01:12:13 PM COMPARISON: 07/02/2018 CLINICAL HISTORY: pre-op. Chest xray for surgery clearance FINDINGS: LUNGS AND PLEURA: Hyperinflated lungs. No pleural effusion. No pneumothorax. HEART AND MEDIASTINUM: No acute abnormality of the cardiac and mediastinal silhouettes. BONES AND SOFT TISSUES: No acute osseous abnormality. IMPRESSION: 1. No  acute abnormalities. 2. Hyperinflated lungs. Electronically signed by: Norleen Boxer MD 09/14/2024 01:57 PM EDT RP Workstation: [id number]    Allergies  Allergen Reactions   Codeine Nausea And Vomiting    Past Medical History:  Diagnosis Date   Central retinal artery occlusion of right eye    Coronary artery disease    HTN (hypertension)    Social History   Socioeconomic History   Marital status: Divorced    Spouse name: Not on file   Number of children: Not on file   Years of education: Not on file   Highest education level: Not on file  Occupational History   Not on file  Tobacco Use   Smoking status: Every Day    Current packs/day: 1.00    Average packs/day: 1 pack/day for 30.0 years (30.0 ttl pk-yrs)    Types: Cigarettes   Smokeless tobacco: Never  Vaping Use   Vaping status: Never Used  Substance and Sexual Activity   Alcohol use: Yes    Comment: 3-4 cans/day   Drug use: No   Sexual activity: Not on file  Other Topics Concern   Not on file  Social History Narrative   Not on file   Social Drivers of Health   Financial Resource Strain: Not on file  Food Insecurity: No Food Insecurity (06/30/2023)   Hunger Vital Sign    Worried About Running Out of Food in the Last Year: Never true    Ran Out of Food in the Last Year: Never true  Transportation Needs: No Transportation Needs (06/30/2023)  PRAPARE - Administrator, Civil Service (Medical): No    Lack of Transportation (Non-Medical): No  Physical Activity: Sufficiently Active (07/18/2024)   Exercise Vital Sign    Days of Exercise per Week: 5 days    Minutes of Exercise per Session: 150+ min  Stress: Not on file  Social Connections: Socially Isolated (07/18/2024)   Social Connection and Isolation Panel    Frequency of Communication with Friends and Family: Twice a week    Frequency of Social Gatherings with Friends and Family: Once a week    Attends Religious Services: Never    Doctor, general practice or Organizations: No    Attends Banker Meetings: Never    Marital Status: Divorced  Catering manager Violence: Not At Risk (06/30/2023)   Humiliation, Afraid, Rape, and Kick questionnaire    Fear of Current or Ex-Partner: No    Emotionally Abused: No    Physically Abused: No    Sexually Abused: No   Family History  Problem Relation Age of Onset   CAD Mother    Diabetes Mellitus II Father    Diabetes Mellitus II Sister    Past Surgical History:  Procedure Laterality Date   CARDIAC CATHETERIZATION     CORONARY ANGIOPLASTY     LEFT HEART CATHETERIZATION WITH CORONARY ANGIOGRAM N/A 10/11/2012   Procedure: LEFT HEART CATHETERIZATION WITH CORONARY ANGIOGRAM;  Surgeon: Rober LOISE Chroman, MD;  Location: [facility];  Service: Cardiovascular;  Laterality: N/A;   right arm surgery       Rolinda Rogue, MD 09/14/24 1559

## 2024-09-14 NOTE — ED Triage Notes (Signed)
 Pt here requesting pre-surgical labwork and chest xray. Also reports he has had a cough. He does not want any medications that may affect his upcoming surgery to extract his right eye.

## 2024-09-14 NOTE — Discharge Instructions (Signed)
 You have had labs (blood tests) sent today. We will call you with any significant abnormalities or if there is need to begin or change treatment or pursue further follow up.  You may also review your test results online through MyChart. If you do not have a MyChart account, instructions to sign up should be on your discharge paperwork.

## 2024-09-15 LAB — CBC WITH DIFFERENTIAL/PLATELET
Basophils Absolute: 0.1 x10E3/uL (ref 0.0–0.2)
Basos: 1 %
EOS (ABSOLUTE): 0.1 x10E3/uL (ref 0.0–0.4)
Eos: 1 %
Hematocrit: 46 % (ref 37.5–51.0)
Hemoglobin: 15.3 g/dL (ref 13.0–17.7)
Immature Grans (Abs): 0 x10E3/uL (ref 0.0–0.1)
Immature Granulocytes: 0 %
Lymphocytes Absolute: 2.5 x10E3/uL (ref 0.7–3.1)
Lymphs: 31 %
MCH: 31.7 pg (ref 26.6–33.0)
MCHC: 33.3 g/dL (ref 31.5–35.7)
MCV: 95 fL (ref 79–97)
Monocytes Absolute: 0.5 x10E3/uL (ref 0.1–0.9)
Monocytes: 7 %
Neutrophils Absolute: 4.8 x10E3/uL (ref 1.4–7.0)
Neutrophils: 59 %
Platelets: 343 x10E3/uL (ref 150–450)
RBC: 4.83 x10E6/uL (ref 4.14–5.80)
RDW: 12.9 % (ref 11.6–15.4)
WBC: 8.1 x10E3/uL (ref 3.4–10.8)

## 2024-09-15 LAB — COMPREHENSIVE METABOLIC PANEL WITH GFR
ALT: 17 IU/L (ref 0–44)
AST: 20 IU/L (ref 0–40)
Albumin: 4.8 g/dL (ref 3.8–4.9)
Alkaline Phosphatase: 89 IU/L (ref 47–123)
BUN/Creatinine Ratio: 14 (ref 10–24)
BUN: 14 mg/dL (ref 8–27)
Bilirubin Total: 0.6 mg/dL (ref 0.0–1.2)
CO2: 17 mmol/L — ABNORMAL LOW (ref 20–29)
Calcium: 10 mg/dL (ref 8.6–10.2)
Chloride: 98 mmol/L (ref 96–106)
Creatinine, Ser: 0.99 mg/dL (ref 0.76–1.27)
Globulin, Total: 2.8 g/dL (ref 1.5–4.5)
Glucose: 109 mg/dL — ABNORMAL HIGH (ref 70–99)
Potassium: 4.4 mmol/L (ref 3.5–5.2)
Sodium: 138 mmol/L (ref 134–144)
Total Protein: 7.6 g/dL (ref 6.0–8.5)
eGFR: 87 mL/min/1.73 (ref >59)

## 2024-09-18 ENCOUNTER — Ambulatory Visit (HOSPITAL_COMMUNITY): Payer: Self-pay

## 2024-11-20 ENCOUNTER — Other Ambulatory Visit (HOSPITAL_BASED_OUTPATIENT_CLINIC_OR_DEPARTMENT_OTHER): Payer: Self-pay
